# Patient Record
Sex: Male | Born: 1965 | Race: White | Hispanic: No | Smoking: Current every day smoker
Health system: Southern US, Community
[De-identification: ages and names within clinical notes are randomized; demographics above are authoritative.]

## PROBLEM LIST (undated history)

## (undated) DIAGNOSIS — F32A Depression, unspecified: Secondary | ICD-10-CM

## (undated) DIAGNOSIS — J189 Pneumonia, unspecified organism: Secondary | ICD-10-CM

## (undated) DIAGNOSIS — F329 Major depressive disorder, single episode, unspecified: Secondary | ICD-10-CM

## (undated) DIAGNOSIS — I639 Cerebral infarction, unspecified: Secondary | ICD-10-CM

## (undated) DIAGNOSIS — F1120 Opioid dependence, uncomplicated: Secondary | ICD-10-CM

## (undated) HISTORY — PX: CERVICAL SPINE SURGERY: SHX589

---

## 2009-05-03 ENCOUNTER — Emergency Department: Payer: Self-pay | Admitting: Internal Medicine

## 2009-09-01 ENCOUNTER — Emergency Department: Payer: Self-pay | Admitting: Internal Medicine

## 2010-10-18 ENCOUNTER — Emergency Department: Payer: Self-pay | Admitting: Emergency Medicine

## 2010-12-05 ENCOUNTER — Ambulatory Visit: Payer: Self-pay | Admitting: Family Medicine

## 2011-01-12 ENCOUNTER — Emergency Department: Payer: Self-pay | Admitting: *Deleted

## 2011-01-14 ENCOUNTER — Emergency Department: Payer: Self-pay | Admitting: Emergency Medicine

## 2011-02-19 ENCOUNTER — Ambulatory Visit
Admission: RE | Admit: 2011-02-19 | Discharge: 2011-02-19 | Disposition: A | Discharge status: Home / Self Care | Payer: BC Managed Care – PPO | Source: Ambulatory Visit | Attending: Neurosurgery | Admitting: Neurosurgery

## 2011-02-19 ENCOUNTER — Other Ambulatory Visit: Payer: Self-pay | Admitting: Neurosurgery

## 2011-02-19 DIAGNOSIS — M542 Cervicalgia: Secondary | ICD-10-CM

## 2011-02-19 DIAGNOSIS — M5020 Other cervical disc displacement, unspecified cervical region: Secondary | ICD-10-CM

## 2011-02-19 DIAGNOSIS — M47812 Spondylosis without myelopathy or radiculopathy, cervical region: Secondary | ICD-10-CM

## 2011-03-27 ENCOUNTER — Ambulatory Visit: Payer: Self-pay | Admitting: Neurosurgery

## 2011-07-07 ENCOUNTER — Emergency Department: Payer: Self-pay | Admitting: Emergency Medicine

## 2011-08-22 ENCOUNTER — Ambulatory Visit: Payer: Self-pay | Admitting: Unknown Physician Specialty

## 2011-08-22 LAB — URINALYSIS, COMPLETE
Bilirubin,UR: NEGATIVE
Blood: NEGATIVE
Glucose,UR: NEGATIVE mg/dL (ref 0–75)
Leukocyte Esterase: NEGATIVE
Nitrite: NEGATIVE
Ph: 5 (ref 4.5–8.0)
Protein: NEGATIVE
Squamous Epithelial: <1

## 2011-08-22 LAB — CBC
HCT: 36.3 % — ABNORMAL LOW (ref 40.0–52.0)
HGB: 12.4 g/dL — ABNORMAL LOW (ref 13.0–18.0)
MCHC: 34.1 g/dL (ref 32.0–36.0)
MCV: 93 fL (ref 80–100)
RDW: 13.3 % (ref 11.5–14.5)

## 2011-08-22 LAB — BASIC METABOLIC PANEL
Calcium, Total: 8.7 mg/dL (ref 8.5–10.1)
Creatinine: 0.95 mg/dL (ref 0.60–1.30)
EGFR (African American): >60
EGFR (Non-African Amer.): >60
Glucose: 85 mg/dL (ref 65–99)
Potassium: 4.5 mmol/L (ref 3.5–5.1)
Sodium: 142 mmol/L (ref 136–145)

## 2011-08-29 ENCOUNTER — Ambulatory Visit: Payer: Self-pay | Admitting: Unknown Physician Specialty

## 2012-02-29 ENCOUNTER — Emergency Department: Payer: Self-pay | Admitting: Internal Medicine

## 2012-02-29 LAB — CBC
HCT: 38 % — ABNORMAL LOW (ref 40.0–52.0)
MCV: 93 fL (ref 80–100)
Platelet: 412 10*3/uL (ref 150–440)
RBC: 4.06 10*6/uL — ABNORMAL LOW (ref 4.40–5.90)
WBC: 8.4 10*3/uL (ref 3.8–10.6)

## 2012-02-29 LAB — URINALYSIS, COMPLETE
Bacteria: NONE SEEN
Glucose,UR: NEGATIVE mg/dL (ref 0–75)
Leukocyte Esterase: NEGATIVE
Nitrite: NEGATIVE
Protein: NEGATIVE
Squamous Epithelial: <1
WBC UR: 2 /HPF (ref 0–5)

## 2012-02-29 LAB — BASIC METABOLIC PANEL
Calcium, Total: 8.8 mg/dL (ref 8.5–10.1)
Chloride: 103 mmol/L (ref 98–107)
Co2: 28 mmol/L (ref 21–32)
Creatinine: 1 mg/dL (ref 0.60–1.30)
Potassium: 4.3 mmol/L (ref 3.5–5.1)
Sodium: 139 mmol/L (ref 136–145)

## 2012-04-25 ENCOUNTER — Emergency Department: Payer: Self-pay | Admitting: Unknown Physician Specialty

## 2012-05-14 ENCOUNTER — Ambulatory Visit: Payer: Self-pay | Admitting: Pain Medicine

## 2012-06-25 ENCOUNTER — Emergency Department: Payer: Self-pay | Admitting: Emergency Medicine

## 2014-01-07 ENCOUNTER — Emergency Department: Payer: Self-pay | Admitting: Emergency Medicine

## 2014-01-11 ENCOUNTER — Emergency Department: Payer: Self-pay | Admitting: Emergency Medicine

## 2014-02-17 ENCOUNTER — Emergency Department: Payer: Self-pay | Admitting: Emergency Medicine

## 2014-02-21 ENCOUNTER — Emergency Department: Payer: Self-pay | Admitting: Emergency Medicine

## 2014-02-21 LAB — BASIC METABOLIC PANEL
ANION GAP: 5 — AB (ref 7–16)
BUN: 11 mg/dL (ref 7–18)
CO2: 30 mmol/L (ref 21–32)
CREATININE: 0.89 mg/dL (ref 0.60–1.30)
Calcium, Total: 8.5 mg/dL (ref 8.5–10.1)
Chloride: 101 mmol/L (ref 98–107)
EGFR (African American): >60
EGFR (Non-African Amer.): >60
Glucose: 113 mg/dL — ABNORMAL HIGH (ref 65–99)
OSMOLALITY: 272 (ref 275–301)
POTASSIUM: 3.8 mmol/L (ref 3.5–5.1)
SODIUM: 136 mmol/L (ref 136–145)

## 2014-02-21 LAB — CBC WITH DIFFERENTIAL/PLATELET
BASOS ABS: 0.1 10*3/uL (ref 0.0–0.1)
Basophil %: 1 %
EOS ABS: 0.4 10*3/uL (ref 0.0–0.7)
Eosinophil %: 4.8 %
HCT: 39.4 % — ABNORMAL LOW (ref 40.0–52.0)
HGB: 13.3 g/dL (ref 13.0–18.0)
Lymphocyte #: 1.4 10*3/uL (ref 1.0–3.6)
Lymphocyte %: 16.8 %
MCH: 34 pg (ref 26.0–34.0)
MCHC: 33.7 g/dL (ref 32.0–36.0)
MCV: 101 fL — AB (ref 80–100)
MONO ABS: 0.8 x10 3/mm (ref 0.2–1.0)
Monocyte %: 9.9 %
NEUTROS PCT: 67.5 %
Neutrophil #: 5.6 10*3/uL (ref 1.4–6.5)
Platelet: 429 10*3/uL (ref 150–440)
RBC: 3.9 10*6/uL — ABNORMAL LOW (ref 4.40–5.90)
RDW: 13.4 % (ref 11.5–14.5)
WBC: 8.3 10*3/uL (ref 3.8–10.6)

## 2014-04-24 ENCOUNTER — Inpatient Hospital Stay: Payer: Self-pay | Admitting: Internal Medicine

## 2014-04-24 LAB — COMPREHENSIVE METABOLIC PANEL
ALT: 90 U/L — AB
AST: 72 U/L — AB (ref 15–37)
Albumin: 3.3 g/dL — ABNORMAL LOW (ref 3.4–5.0)
Alkaline Phosphatase: 112 U/L
Anion Gap: 5 — ABNORMAL LOW (ref 7–16)
BUN: 12 mg/dL (ref 7–18)
Bilirubin,Total: 0.5 mg/dL (ref 0.2–1.0)
CALCIUM: 7.2 mg/dL — AB (ref 8.5–10.1)
CO2: 39 mmol/L — AB (ref 21–32)
Chloride: 95 mmol/L — ABNORMAL LOW (ref 98–107)
Creatinine: 0.99 mg/dL (ref 0.60–1.30)
EGFR (African American): >60
EGFR (Non-African Amer.): >60
GLUCOSE: 123 mg/dL — AB (ref 65–99)
Osmolality: 279 (ref 275–301)
POTASSIUM: 3.4 mmol/L — AB (ref 3.5–5.1)
SODIUM: 139 mmol/L (ref 136–145)
Total Protein: 6.8 g/dL (ref 6.4–8.2)

## 2014-04-24 LAB — INFLUENZA A,B,H1N1 - PCR (ARMC)
H1N1 flu by pcr: NOT DETECTED
INFLAPCR: NEGATIVE
Influenza B By PCR: NEGATIVE

## 2014-04-24 LAB — CBC WITH DIFFERENTIAL/PLATELET
BASOS PCT: 0.9 %
Basophil #: 0.1 10*3/uL (ref 0.0–0.1)
Eosinophil #: 0.2 10*3/uL (ref 0.0–0.7)
Eosinophil %: 3.2 %
HCT: 35.9 % — AB (ref 40.0–52.0)
HGB: 12.2 g/dL — AB (ref 13.0–18.0)
Lymphocyte #: 1.6 10*3/uL (ref 1.0–3.6)
Lymphocyte %: 20.3 %
MCH: 34.5 pg — AB (ref 26.0–34.0)
MCHC: 34.1 g/dL (ref 32.0–36.0)
MCV: 101 fL — ABNORMAL HIGH (ref 80–100)
Monocyte #: 0.7 x10 3/mm (ref 0.2–1.0)
Monocyte %: 8.5 %
NEUTROS ABS: 5.2 10*3/uL (ref 1.4–6.5)
NEUTROS PCT: 67.1 %
Platelet: 274 10*3/uL (ref 150–440)
RBC: 3.55 10*6/uL — ABNORMAL LOW (ref 4.40–5.90)
RDW: 14.2 % (ref 11.5–14.5)
WBC: 7.7 10*3/uL (ref 3.8–10.6)

## 2014-04-24 LAB — URINALYSIS, COMPLETE
BACTERIA: NONE SEEN
BILIRUBIN, UR: NEGATIVE
BLOOD: NEGATIVE
GLUCOSE, UR: NEGATIVE mg/dL (ref 0–75)
Ketone: NEGATIVE
LEUKOCYTE ESTERASE: NEGATIVE
Nitrite: NEGATIVE
PROTEIN: NEGATIVE
Ph: 8 (ref 4.5–8.0)
RBC,UR: NONE SEEN /HPF (ref 0–5)
Specific Gravity: 1.006 (ref 1.003–1.030)
Squamous Epithelial: NONE SEEN

## 2014-04-24 LAB — MAGNESIUM: Magnesium: 1.7 mg/dL — ABNORMAL LOW

## 2014-04-24 LAB — TROPONIN I: TROPONIN-I: 0.02 ng/mL

## 2014-04-25 ENCOUNTER — Inpatient Hospital Stay: Payer: Self-pay | Admitting: Specialist

## 2014-04-25 LAB — COMPREHENSIVE METABOLIC PANEL
ALBUMIN: 3.2 g/dL — AB (ref 3.4–5.0)
Alkaline Phosphatase: 94 U/L
Anion Gap: 5 — ABNORMAL LOW (ref 7–16)
BUN: 16 mg/dL (ref 7–18)
Bilirubin,Total: 0.4 mg/dL (ref 0.2–1.0)
CHLORIDE: 99 mmol/L (ref 98–107)
Calcium, Total: 7.1 mg/dL — ABNORMAL LOW (ref 8.5–10.1)
Co2: 35 mmol/L — ABNORMAL HIGH (ref 21–32)
Creatinine: 1.09 mg/dL (ref 0.60–1.30)
GLUCOSE: 147 mg/dL — AB (ref 65–99)
Osmolality: 281 (ref 275–301)
Potassium: 5.9 mmol/L — ABNORMAL HIGH (ref 3.5–5.1)
SGOT(AST): 62 U/L — ABNORMAL HIGH (ref 15–37)
SGPT (ALT): 72 U/L — ABNORMAL HIGH
Sodium: 139 mmol/L (ref 136–145)
Total Protein: 6.7 g/dL (ref 6.4–8.2)

## 2014-04-25 LAB — MAGNESIUM: Magnesium: 1.9 mg/dL

## 2014-04-25 LAB — URINALYSIS, COMPLETE
BILIRUBIN, UR: NEGATIVE
BLOOD: NEGATIVE
Bacteria: NONE SEEN
Glucose,UR: NEGATIVE mg/dL (ref 0–75)
KETONE: NEGATIVE
Leukocyte Esterase: NEGATIVE
Nitrite: NEGATIVE
Ph: 5 (ref 4.5–8.0)
Protein: NEGATIVE
Specific Gravity: 1.017 (ref 1.003–1.030)

## 2014-04-25 LAB — DRUG SCREEN, URINE
AMPHETAMINES, UR SCREEN: NEGATIVE (ref ?–1000)
Barbiturates, Ur Screen: NEGATIVE (ref ?–200)
Benzodiazepine, Ur Scrn: POSITIVE (ref ?–200)
COCAINE METABOLITE, UR ~~LOC~~: NEGATIVE (ref ?–300)
Cannabinoid 50 Ng, Ur ~~LOC~~: NEGATIVE (ref ?–50)
MDMA (Ecstasy)Ur Screen: NEGATIVE (ref ?–500)
Methadone, Ur Screen: POSITIVE (ref ?–300)
OPIATE, UR SCREEN: POSITIVE (ref ?–300)
Phencyclidine (PCP) Ur S: NEGATIVE (ref ?–25)
Tricyclic, Ur Screen: NEGATIVE (ref ?–1000)

## 2014-04-25 LAB — CBC WITH DIFFERENTIAL/PLATELET
BASOS PCT: 1.1 %
Basophil #: 0.1 10*3/uL (ref 0.0–0.1)
Eosinophil #: 0 10*3/uL (ref 0.0–0.7)
Eosinophil %: 0.2 %
HCT: 35.6 % — ABNORMAL LOW (ref 40.0–52.0)
HGB: 11.7 g/dL — AB (ref 13.0–18.0)
LYMPHS PCT: 16.9 %
Lymphocyte #: 1.8 10*3/uL (ref 1.0–3.6)
MCH: 34.9 pg — ABNORMAL HIGH (ref 26.0–34.0)
MCHC: 32.9 g/dL (ref 32.0–36.0)
MCV: 106 fL — AB (ref 80–100)
Monocyte #: 1.3 x10 3/mm — ABNORMAL HIGH (ref 0.2–1.0)
Monocyte %: 12.5 %
NEUTROS PCT: 69.3 %
Neutrophil #: 7.2 10*3/uL — ABNORMAL HIGH (ref 1.4–6.5)
Platelet: 282 10*3/uL (ref 150–440)
RBC: 3.36 10*6/uL — ABNORMAL LOW (ref 4.40–5.90)
RDW: 14.4 % (ref 11.5–14.5)
WBC: 10.5 10*3/uL (ref 3.8–10.6)

## 2014-04-25 LAB — TROPONIN I
TROPONIN-I: 0.1 ng/mL — AB
TROPONIN-I: 0.64 ng/mL — AB
Troponin-I: 0.51 ng/mL — ABNORMAL HIGH

## 2014-04-25 LAB — POTASSIUM: Potassium: 4.9 mmol/L (ref 3.5–5.1)

## 2014-04-25 LAB — PROTIME-INR
INR: 1.1
Prothrombin Time: 14 secs (ref 11.5–14.7)

## 2014-04-25 LAB — CK-MB
CK-MB: 3.5 ng/mL (ref 0.5–3.6)
CK-MB: 4.1 ng/mL — AB (ref 0.5–3.6)

## 2014-04-25 LAB — PHOSPHORUS: Phosphorus: 3.3 mg/dL (ref 2.5–4.9)

## 2014-04-26 DIAGNOSIS — I340 Nonrheumatic mitral (valve) insufficiency: Secondary | ICD-10-CM

## 2014-04-26 LAB — PHOSPHORUS: Phosphorus: 1.8 mg/dL — ABNORMAL LOW (ref 2.5–4.9)

## 2014-04-26 LAB — CBC WITH DIFFERENTIAL/PLATELET
BASOS ABS: 0.1 10*3/uL (ref 0.0–0.1)
Basophil %: 0.6 %
EOS ABS: 0.4 10*3/uL (ref 0.0–0.7)
Eosinophil %: 4.1 %
HCT: 32.6 % — ABNORMAL LOW (ref 40.0–52.0)
HGB: 10.8 g/dL — AB (ref 13.0–18.0)
LYMPHS PCT: 23.3 %
Lymphocyte #: 2.3 10*3/uL (ref 1.0–3.6)
MCH: 34.7 pg — ABNORMAL HIGH (ref 26.0–34.0)
MCHC: 33 g/dL (ref 32.0–36.0)
MCV: 105 fL — ABNORMAL HIGH (ref 80–100)
Monocyte #: 0.8 x10 3/mm (ref 0.2–1.0)
Monocyte %: 8.4 %
Neutrophil #: 6.1 10*3/uL (ref 1.4–6.5)
Neutrophil %: 63.6 %
PLATELETS: 228 10*3/uL (ref 150–440)
RBC: 3.1 10*6/uL — ABNORMAL LOW (ref 4.40–5.90)
RDW: 14.5 % (ref 11.5–14.5)
WBC: 9.7 10*3/uL (ref 3.8–10.6)

## 2014-04-26 LAB — BASIC METABOLIC PANEL
Anion Gap: 0 — ABNORMAL LOW (ref 7–16)
BUN: 16 mg/dL (ref 7–18)
CALCIUM: 6.7 mg/dL — AB (ref 8.5–10.1)
CREATININE: 0.85 mg/dL (ref 0.60–1.30)
Chloride: 105 mmol/L (ref 98–107)
Co2: 39 mmol/L — ABNORMAL HIGH (ref 21–32)
EGFR (African American): >60
Glucose: 105 mg/dL — ABNORMAL HIGH (ref 65–99)
Osmolality: 288 (ref 275–301)
POTASSIUM: 3.9 mmol/L (ref 3.5–5.1)
Sodium: 144 mmol/L (ref 136–145)

## 2014-04-26 LAB — MAGNESIUM: MAGNESIUM: 2 mg/dL

## 2014-04-26 LAB — CK-MB: CK-MB: 4.4 ng/mL — AB (ref 0.5–3.6)

## 2014-04-26 LAB — URINE CULTURE

## 2014-04-27 LAB — CBC WITH DIFFERENTIAL/PLATELET
BASOS PCT: 0.4 %
Basophil #: 0 10*3/uL (ref 0.0–0.1)
EOS PCT: 5.4 %
Eosinophil #: 0.4 10*3/uL (ref 0.0–0.7)
HCT: 31.4 % — ABNORMAL LOW (ref 40.0–52.0)
HGB: 10.4 g/dL — ABNORMAL LOW (ref 13.0–18.0)
LYMPHS PCT: 16.3 %
Lymphocyte #: 1.3 10*3/uL (ref 1.0–3.6)
MCH: 34.6 pg — ABNORMAL HIGH (ref 26.0–34.0)
MCHC: 33.3 g/dL (ref 32.0–36.0)
MCV: 104 fL — ABNORMAL HIGH (ref 80–100)
MONO ABS: 0.7 x10 3/mm (ref 0.2–1.0)
MONOS PCT: 8.6 %
NEUTROS ABS: 5.6 10*3/uL (ref 1.4–6.5)
Neutrophil %: 69.3 %
Platelet: 224 10*3/uL (ref 150–440)
RBC: 3.01 10*6/uL — AB (ref 4.40–5.90)
RDW: 13.9 % (ref 11.5–14.5)
WBC: 8.1 10*3/uL (ref 3.8–10.6)

## 2014-04-27 LAB — BASIC METABOLIC PANEL
Anion Gap: 4 — ABNORMAL LOW (ref 7–16)
BUN: 11 mg/dL (ref 7–18)
CO2: 34 mmol/L — AB (ref 21–32)
Calcium, Total: 7.4 mg/dL — ABNORMAL LOW (ref 8.5–10.1)
Chloride: 103 mmol/L (ref 98–107)
Creatinine: 0.77 mg/dL (ref 0.60–1.30)
EGFR (African American): >60
EGFR (Non-African Amer.): >60
GLUCOSE: 90 mg/dL (ref 65–99)
Osmolality: 280 (ref 275–301)
POTASSIUM: 3.5 mmol/L (ref 3.5–5.1)
Sodium: 141 mmol/L (ref 136–145)

## 2014-04-27 LAB — PHOSPHORUS: Phosphorus: 3.4 mg/dL (ref 2.5–4.9)

## 2014-04-27 LAB — MAGNESIUM: Magnesium: 1.9 mg/dL

## 2014-04-28 LAB — CBC WITH DIFFERENTIAL/PLATELET
BASOS PCT: 0.3 %
Basophil #: 0 10*3/uL (ref 0.0–0.1)
Eosinophil #: 0 10*3/uL (ref 0.0–0.7)
Eosinophil %: 0 %
HCT: 32.1 % — AB (ref 40.0–52.0)
HGB: 10.6 g/dL — AB (ref 13.0–18.0)
LYMPHS ABS: 0.6 10*3/uL — AB (ref 1.0–3.6)
LYMPHS PCT: 9.7 %
MCH: 34.3 pg — ABNORMAL HIGH (ref 26.0–34.0)
MCHC: 32.9 g/dL (ref 32.0–36.0)
MCV: 104 fL — ABNORMAL HIGH (ref 80–100)
Monocyte #: 0.4 x10 3/mm (ref 0.2–1.0)
Monocyte %: 6 %
NEUTROS ABS: 5.1 10*3/uL (ref 1.4–6.5)
Neutrophil %: 84 %
PLATELETS: 238 10*3/uL (ref 150–440)
RBC: 3.08 10*6/uL — ABNORMAL LOW (ref 4.40–5.90)
RDW: 14.5 % (ref 11.5–14.5)
WBC: 6 10*3/uL (ref 3.8–10.6)

## 2014-04-28 LAB — MAGNESIUM: Magnesium: 2.4 mg/dL

## 2014-04-28 LAB — BASIC METABOLIC PANEL
ANION GAP: 3 — AB (ref 7–16)
BUN: 9 mg/dL (ref 7–18)
CO2: 33 mmol/L — AB (ref 21–32)
CREATININE: 0.73 mg/dL (ref 0.60–1.30)
Calcium, Total: 7.6 mg/dL — ABNORMAL LOW (ref 8.5–10.1)
Chloride: 110 mmol/L — ABNORMAL HIGH (ref 98–107)
EGFR (African American): >60
EGFR (Non-African Amer.): >60
Glucose: 142 mg/dL — ABNORMAL HIGH (ref 65–99)
Osmolality: 292 (ref 275–301)
POTASSIUM: 4.2 mmol/L (ref 3.5–5.1)
Sodium: 146 mmol/L — ABNORMAL HIGH (ref 136–145)

## 2014-04-29 LAB — BASIC METABOLIC PANEL
ANION GAP: 6 — AB (ref 7–16)
BUN: 16 mg/dL (ref 7–18)
CREATININE: 0.75 mg/dL (ref 0.60–1.30)
Calcium, Total: 7.4 mg/dL — ABNORMAL LOW (ref 8.5–10.1)
Chloride: 106 mmol/L (ref 98–107)
Co2: 29 mmol/L (ref 21–32)
EGFR (African American): >60
GLUCOSE: 132 mg/dL — AB (ref 65–99)
Osmolality: 284 (ref 275–301)
Potassium: 3.7 mmol/L (ref 3.5–5.1)
Sodium: 141 mmol/L (ref 136–145)

## 2014-04-29 LAB — RAPID HIV SCREEN (HIV 1/2 AB+AG)

## 2014-04-29 LAB — MAGNESIUM: Magnesium: 1.9 mg/dL

## 2014-04-29 LAB — PHOSPHORUS: Phosphorus: 3 mg/dL (ref 2.5–4.9)

## 2014-04-30 DIAGNOSIS — J9600 Acute respiratory failure, unspecified whether with hypoxia or hypercapnia: Secondary | ICD-10-CM | POA: Diagnosis not present

## 2014-04-30 DIAGNOSIS — G9341 Metabolic encephalopathy: Secondary | ICD-10-CM

## 2014-04-30 LAB — BASIC METABOLIC PANEL
Anion Gap: 5 — ABNORMAL LOW (ref 7–16)
BUN: 18 mg/dL (ref 7–18)
CO2: 31 mmol/L (ref 21–32)
Calcium, Total: 7.8 mg/dL — ABNORMAL LOW (ref 8.5–10.1)
Chloride: 104 mmol/L (ref 98–107)
Creatinine: 0.75 mg/dL (ref 0.60–1.30)
EGFR (African American): >60
Glucose: 142 mg/dL — ABNORMAL HIGH (ref 65–99)
Osmolality: 284 (ref 275–301)
Potassium: 4 mmol/L (ref 3.5–5.1)
Sodium: 140 mmol/L (ref 136–145)

## 2014-04-30 LAB — CBC WITH DIFFERENTIAL/PLATELET
Basophil #: 0 10*3/uL (ref 0.0–0.1)
Basophil %: 0.6 %
EOS ABS: 0 10*3/uL (ref 0.0–0.7)
Eosinophil %: 0 %
HCT: 35.9 % — AB (ref 40.0–52.0)
HGB: 11.8 g/dL — AB (ref 13.0–18.0)
Lymphocyte #: 0.6 10*3/uL — ABNORMAL LOW (ref 1.0–3.6)
Lymphocyte %: 7.2 %
MCH: 34 pg (ref 26.0–34.0)
MCHC: 32.9 g/dL (ref 32.0–36.0)
MCV: 103 fL — ABNORMAL HIGH (ref 80–100)
MONO ABS: 0.6 x10 3/mm (ref 0.2–1.0)
Monocyte %: 7.1 %
Neutrophil #: 7 10*3/uL — ABNORMAL HIGH (ref 1.4–6.5)
Neutrophil %: 85.1 %
Platelet: 289 10*3/uL (ref 150–440)
RBC: 3.49 10*6/uL — AB (ref 4.40–5.90)
RDW: 14.6 % — AB (ref 11.5–14.5)
WBC: 8.3 10*3/uL (ref 3.8–10.6)

## 2014-04-30 LAB — TRIGLYCERIDES: Triglycerides: 148 mg/dL (ref 0–200)

## 2014-04-30 LAB — VANCOMYCIN, TROUGH
Vancomycin, Trough: 19 ug/mL (ref 10–20)
Vancomycin, Trough: 21 ug/mL (ref 10–20)

## 2014-04-30 LAB — MAGNESIUM: MAGNESIUM: 2.2 mg/dL

## 2014-04-30 LAB — PHOSPHORUS: PHOSPHORUS: 4.3 mg/dL (ref 2.5–4.9)

## 2014-04-30 LAB — CULTURE, BLOOD (SINGLE)

## 2014-04-30 LAB — CLOSTRIDIUM DIFFICILE(ARMC)

## 2014-04-30 LAB — EXPECTORATED SPUTUM ASSESSMENT W GRAM STAIN, RFLX TO RESP C

## 2014-05-01 DIAGNOSIS — G9341 Metabolic encephalopathy: Secondary | ICD-10-CM | POA: Diagnosis not present

## 2014-05-01 DIAGNOSIS — J9600 Acute respiratory failure, unspecified whether with hypoxia or hypercapnia: Secondary | ICD-10-CM | POA: Diagnosis not present

## 2014-05-01 LAB — CBC WITH DIFFERENTIAL/PLATELET
Basophil #: 0.1 10*3/uL (ref 0.0–0.1)
Basophil %: 1.1 %
EOS ABS: 0 10*3/uL (ref 0.0–0.7)
EOS PCT: 0.2 %
HCT: 34.8 % — ABNORMAL LOW (ref 40.0–52.0)
HGB: 11.2 g/dL — ABNORMAL LOW (ref 13.0–18.0)
Lymphocyte #: 0.9 10*3/uL — ABNORMAL LOW (ref 1.0–3.6)
Lymphocyte %: 12.2 %
MCH: 33.8 pg (ref 26.0–34.0)
MCHC: 32.3 g/dL (ref 32.0–36.0)
MCV: 105 fL — AB (ref 80–100)
MONO ABS: 0.7 x10 3/mm (ref 0.2–1.0)
MONOS PCT: 10.4 %
NEUTROS PCT: 76.1 %
Neutrophil #: 5.4 10*3/uL (ref 1.4–6.5)
PLATELETS: 282 10*3/uL (ref 150–440)
RBC: 3.33 10*6/uL — ABNORMAL LOW (ref 4.40–5.90)
RDW: 14.8 % — ABNORMAL HIGH (ref 11.5–14.5)
WBC: 7.1 10*3/uL (ref 3.8–10.6)

## 2014-05-01 LAB — BASIC METABOLIC PANEL
ANION GAP: 4 — AB (ref 7–16)
BUN: 16 mg/dL (ref 7–18)
Calcium, Total: 7.3 mg/dL — ABNORMAL LOW (ref 8.5–10.1)
Chloride: 106 mmol/L (ref 98–107)
Co2: 34 mmol/L — ABNORMAL HIGH (ref 21–32)
Creatinine: 0.64 mg/dL (ref 0.60–1.30)
EGFR (African American): >60
GLUCOSE: 129 mg/dL — AB (ref 65–99)
Osmolality: 290 (ref 275–301)
Potassium: 3.9 mmol/L (ref 3.5–5.1)
Sodium: 144 mmol/L (ref 136–145)

## 2014-05-02 LAB — CBC WITH DIFFERENTIAL/PLATELET
Basophil #: 0 10*3/uL (ref 0.0–0.1)
Basophil %: 0.5 %
EOS ABS: 0.1 10*3/uL (ref 0.0–0.7)
Eosinophil %: 1.1 %
HCT: 36.3 % — AB (ref 40.0–52.0)
HGB: 11.9 g/dL — ABNORMAL LOW (ref 13.0–18.0)
Lymphocyte #: 1 10*3/uL (ref 1.0–3.6)
Lymphocyte %: 13.3 %
MCH: 33.9 pg (ref 26.0–34.0)
MCHC: 32.7 g/dL (ref 32.0–36.0)
MCV: 104 fL — ABNORMAL HIGH (ref 80–100)
MONO ABS: 0.7 x10 3/mm (ref 0.2–1.0)
Monocyte %: 10 %
NEUTROS ABS: 5.5 10*3/uL (ref 1.4–6.5)
Neutrophil %: 75.1 %
Platelet: 326 10*3/uL (ref 150–440)
RBC: 3.5 10*6/uL — ABNORMAL LOW (ref 4.40–5.90)
RDW: 14.8 % — ABNORMAL HIGH (ref 11.5–14.5)
WBC: 7.4 10*3/uL (ref 3.8–10.6)

## 2014-05-02 LAB — BASIC METABOLIC PANEL
ANION GAP: 6 — AB (ref 7–16)
BUN: 15 mg/dL (ref 7–18)
CALCIUM: 7.5 mg/dL — AB (ref 8.5–10.1)
Chloride: 106 mmol/L (ref 98–107)
Co2: 33 mmol/L — ABNORMAL HIGH (ref 21–32)
Creatinine: 0.66 mg/dL (ref 0.60–1.30)
EGFR (Non-African Amer.): >60
Glucose: 116 mg/dL — ABNORMAL HIGH (ref 65–99)
OSMOLALITY: 291 (ref 275–301)
POTASSIUM: 4 mmol/L (ref 3.5–5.1)
Sodium: 145 mmol/L (ref 136–145)

## 2014-05-02 LAB — PHOSPHORUS: Phosphorus: 3.9 mg/dL (ref 2.5–4.9)

## 2014-05-02 LAB — TRIGLYCERIDES: Triglycerides: 181 mg/dL (ref 0–200)

## 2014-05-02 LAB — MAGNESIUM: Magnesium: 2.1 mg/dL

## 2014-05-04 LAB — BASIC METABOLIC PANEL
ANION GAP: 3 — AB (ref 7–16)
BUN: 9 mg/dL (ref 7–18)
CALCIUM: 7.6 mg/dL — AB (ref 8.5–10.1)
Chloride: 104 mmol/L (ref 98–107)
Co2: 36 mmol/L — ABNORMAL HIGH (ref 21–32)
Creatinine: 0.62 mg/dL (ref 0.60–1.30)
EGFR (Non-African Amer.): >60
Glucose: 99 mg/dL (ref 65–99)
Osmolality: 284 (ref 275–301)
Potassium: 3.5 mmol/L (ref 3.5–5.1)
Sodium: 143 mmol/L (ref 136–145)

## 2014-05-04 LAB — MAGNESIUM: Magnesium: 2.2 mg/dL

## 2014-05-04 LAB — PHOSPHORUS: Phosphorus: 3.9 mg/dL (ref 2.5–4.9)

## 2014-05-04 LAB — TRIGLYCERIDES: Triglycerides: 186 mg/dL (ref 0–200)

## 2014-05-05 LAB — CBC WITH DIFFERENTIAL/PLATELET
BASOS ABS: 0.1 10*3/uL (ref 0.0–0.1)
Basophil %: 0.7 %
EOS ABS: 0.5 10*3/uL (ref 0.0–0.7)
Eosinophil %: 4.7 %
HCT: 33.4 % — ABNORMAL LOW (ref 40.0–52.0)
HGB: 11.2 g/dL — AB (ref 13.0–18.0)
LYMPHS PCT: 15.4 %
Lymphocyte #: 1.6 10*3/uL (ref 1.0–3.6)
MCH: 33.6 pg (ref 26.0–34.0)
MCHC: 33.4 g/dL (ref 32.0–36.0)
MCV: 101 fL — ABNORMAL HIGH (ref 80–100)
Monocyte #: 0.6 x10 3/mm (ref 0.2–1.0)
Monocyte %: 6.2 %
NEUTROS ABS: 7.4 10*3/uL — AB (ref 1.4–6.5)
NEUTROS PCT: 73 %
PLATELETS: 272 10*3/uL (ref 150–440)
RBC: 3.32 10*6/uL — AB (ref 4.40–5.90)
RDW: 13.9 % (ref 11.5–14.5)
WBC: 10.1 10*3/uL (ref 3.8–10.6)

## 2014-05-05 LAB — BASIC METABOLIC PANEL
Anion Gap: 3 — ABNORMAL LOW (ref 7–16)
BUN: 9 mg/dL (ref 7–18)
CHLORIDE: 105 mmol/L (ref 98–107)
CREATININE: 0.61 mg/dL (ref 0.60–1.30)
Calcium, Total: 7.8 mg/dL — ABNORMAL LOW (ref 8.5–10.1)
Co2: 34 mmol/L — ABNORMAL HIGH (ref 21–32)
EGFR (African American): >60
EGFR (Non-African Amer.): >60
Glucose: 121 mg/dL — ABNORMAL HIGH (ref 65–99)
Osmolality: 283 (ref 275–301)
Potassium: 3.8 mmol/L (ref 3.5–5.1)
Sodium: 142 mmol/L (ref 136–145)

## 2014-05-07 LAB — CBC WITH DIFFERENTIAL/PLATELET
BASOS PCT: 0.5 %
Basophil #: 0.1 10*3/uL (ref 0.0–0.1)
EOS PCT: 3 %
Eosinophil #: 0.5 10*3/uL (ref 0.0–0.7)
HCT: 35 % — AB (ref 40.0–52.0)
HGB: 11.5 g/dL — AB (ref 13.0–18.0)
Lymphocyte #: 1.2 10*3/uL (ref 1.0–3.6)
Lymphocyte %: 8 %
MCH: 33.3 pg (ref 26.0–34.0)
MCHC: 32.9 g/dL (ref 32.0–36.0)
MCV: 101 fL — ABNORMAL HIGH (ref 80–100)
Monocyte #: 0.9 x10 3/mm (ref 0.2–1.0)
Monocyte %: 5.6 %
Neutrophil #: 12.8 10*3/uL — ABNORMAL HIGH (ref 1.4–6.5)
Neutrophil %: 82.9 %
PLATELETS: 436 10*3/uL (ref 150–440)
RBC: 3.46 10*6/uL — AB (ref 4.40–5.90)
RDW: 14.5 % (ref 11.5–14.5)
WBC: 15.4 10*3/uL — ABNORMAL HIGH (ref 3.8–10.6)

## 2014-05-07 LAB — BASIC METABOLIC PANEL
Anion Gap: 4 — ABNORMAL LOW (ref 7–16)
BUN: 9 mg/dL (ref 7–18)
Calcium, Total: 7.8 mg/dL — ABNORMAL LOW (ref 8.5–10.1)
Chloride: 101 mmol/L (ref 98–107)
Co2: 32 mmol/L (ref 21–32)
Creatinine: 0.74 mg/dL (ref 0.60–1.30)
EGFR (Non-African Amer.): >60
GLUCOSE: 117 mg/dL — AB (ref 65–99)
Osmolality: 274 (ref 275–301)
Potassium: 4.1 mmol/L (ref 3.5–5.1)
SODIUM: 137 mmol/L (ref 136–145)

## 2014-05-08 LAB — CREATININE, SERUM
CREATININE: 0.76 mg/dL (ref 0.60–1.30)
EGFR (Non-African Amer.): >60

## 2014-05-09 LAB — CBC WITH DIFFERENTIAL/PLATELET
Basophil #: 0.1 10*3/uL (ref 0.0–0.1)
Basophil %: 0.5 %
EOS ABS: 0.8 10*3/uL — AB (ref 0.0–0.7)
Eosinophil %: 4.2 %
HCT: 31.6 % — ABNORMAL LOW (ref 40.0–52.0)
HGB: 10.3 g/dL — AB (ref 13.0–18.0)
LYMPHS ABS: 0.7 10*3/uL — AB (ref 1.0–3.6)
LYMPHS PCT: 3.7 %
MCH: 33.2 pg (ref 26.0–34.0)
MCHC: 32.7 g/dL (ref 32.0–36.0)
MCV: 101 fL — ABNORMAL HIGH (ref 80–100)
Monocyte #: 1 x10 3/mm (ref 0.2–1.0)
Monocyte %: 5.5 %
Neutrophil #: 16.1 10*3/uL — ABNORMAL HIGH (ref 1.4–6.5)
Neutrophil %: 86.1 %
Platelet: 428 10*3/uL (ref 150–440)
RBC: 3.11 10*6/uL — ABNORMAL LOW (ref 4.40–5.90)
RDW: 14.6 % — ABNORMAL HIGH (ref 11.5–14.5)
WBC: 18.7 10*3/uL — ABNORMAL HIGH (ref 3.8–10.6)

## 2014-05-09 LAB — CREATININE, SERUM
CREATININE: 0.88 mg/dL (ref 0.60–1.30)
EGFR (African American): >60
EGFR (Non-African Amer.): >60

## 2014-05-09 LAB — VANCOMYCIN, TROUGH: Vancomycin, Trough: 24 ug/mL (ref 10–20)

## 2014-05-10 LAB — URINALYSIS, COMPLETE
Bacteria: NONE SEEN
Bilirubin,UR: NEGATIVE
GLUCOSE, UR: NEGATIVE mg/dL (ref 0–75)
Ketone: NEGATIVE
LEUKOCYTE ESTERASE: NEGATIVE
NITRITE: NEGATIVE
Ph: 6 (ref 4.5–8.0)
Protein: 30
RBC,UR: 1 /HPF (ref 0–5)
SPECIFIC GRAVITY: 1.011 (ref 1.003–1.030)
WBC UR: 3 /HPF (ref 0–5)

## 2014-05-10 LAB — CBC WITH DIFFERENTIAL/PLATELET
BASOS PCT: 0.8 %
Basophil #: 0.1 10*3/uL (ref 0.0–0.1)
EOS PCT: 4.7 %
Eosinophil #: 0.8 10*3/uL — ABNORMAL HIGH (ref 0.0–0.7)
HCT: 34.1 % — AB (ref 40.0–52.0)
HGB: 11 g/dL — ABNORMAL LOW (ref 13.0–18.0)
LYMPHS PCT: 4.1 %
Lymphocyte #: 0.7 10*3/uL — ABNORMAL LOW (ref 1.0–3.6)
MCH: 32.8 pg (ref 26.0–34.0)
MCHC: 32.1 g/dL (ref 32.0–36.0)
MCV: 102 fL — AB (ref 80–100)
Monocyte #: 0.7 x10 3/mm (ref 0.2–1.0)
Monocyte %: 4.5 %
NEUTROS ABS: 13.9 10*3/uL — AB (ref 1.4–6.5)
NEUTROS PCT: 85.9 %
Platelet: 490 10*3/uL — ABNORMAL HIGH (ref 150–440)
RBC: 3.34 10*6/uL — AB (ref 4.40–5.90)
RDW: 14.7 % — AB (ref 11.5–14.5)
WBC: 16.2 10*3/uL — AB (ref 3.8–10.6)

## 2014-05-10 LAB — BASIC METABOLIC PANEL
Anion Gap: 8 (ref 7–16)
BUN: 16 mg/dL (ref 7–18)
CHLORIDE: 101 mmol/L (ref 98–107)
CREATININE: 1.07 mg/dL (ref 0.60–1.30)
Calcium, Total: 8 mg/dL — ABNORMAL LOW (ref 8.5–10.1)
Co2: 31 mmol/L (ref 21–32)
EGFR (African American): >60
EGFR (Non-African Amer.): >60
GLUCOSE: 115 mg/dL — AB (ref 65–99)
Osmolality: 282 (ref 275–301)
Potassium: 3.5 mmol/L (ref 3.5–5.1)
Sodium: 140 mmol/L (ref 136–145)

## 2014-05-10 LAB — MAGNESIUM: MAGNESIUM: 1.9 mg/dL

## 2014-05-10 LAB — PHOSPHORUS: Phosphorus: 3.3 mg/dL (ref 2.5–4.9)

## 2014-05-10 LAB — CLOSTRIDIUM DIFFICILE(ARMC)

## 2014-05-11 LAB — CBC WITH DIFFERENTIAL/PLATELET
BASOS ABS: 0.1 10*3/uL (ref 0.0–0.1)
Basophil %: 0.5 %
EOS PCT: 5 %
Eosinophil #: 0.7 10*3/uL (ref 0.0–0.7)
HCT: 29.7 % — ABNORMAL LOW (ref 40.0–52.0)
HGB: 9.8 g/dL — AB (ref 13.0–18.0)
LYMPHS ABS: 0.8 10*3/uL — AB (ref 1.0–3.6)
Lymphocyte %: 6.2 %
MCH: 33.5 pg (ref 26.0–34.0)
MCHC: 33 g/dL (ref 32.0–36.0)
MCV: 102 fL — ABNORMAL HIGH (ref 80–100)
Monocyte #: 1.1 x10 3/mm — ABNORMAL HIGH (ref 0.2–1.0)
Monocyte %: 8 %
Neutrophil #: 11 10*3/uL — ABNORMAL HIGH (ref 1.4–6.5)
Neutrophil %: 80.3 %
Platelet: 447 10*3/uL — ABNORMAL HIGH (ref 150–440)
RBC: 2.92 10*6/uL — AB (ref 4.40–5.90)
RDW: 14.6 % — AB (ref 11.5–14.5)
WBC: 13.7 10*3/uL — ABNORMAL HIGH (ref 3.8–10.6)

## 2014-05-11 LAB — BASIC METABOLIC PANEL
Anion Gap: 4 — ABNORMAL LOW (ref 7–16)
BUN: 14 mg/dL (ref 7–18)
Calcium, Total: 8 mg/dL — ABNORMAL LOW (ref 8.5–10.1)
Chloride: 101 mmol/L (ref 98–107)
Co2: 34 mmol/L — ABNORMAL HIGH (ref 21–32)
Creatinine: 1.11 mg/dL (ref 0.60–1.30)
EGFR (Non-African Amer.): >60
Glucose: 118 mg/dL — ABNORMAL HIGH (ref 65–99)
Osmolality: 279 (ref 275–301)
POTASSIUM: 3.2 mmol/L — AB (ref 3.5–5.1)
SODIUM: 139 mmol/L (ref 136–145)

## 2014-05-11 LAB — PHOSPHORUS: Phosphorus: 4.3 mg/dL (ref 2.5–4.9)

## 2014-05-11 LAB — MAGNESIUM: MAGNESIUM: 2.2 mg/dL

## 2014-05-11 LAB — TRIGLYCERIDES: TRIGLYCERIDES: 255 mg/dL — AB (ref 0–200)

## 2014-05-11 LAB — URINE CULTURE

## 2014-05-12 LAB — BASIC METABOLIC PANEL
Anion Gap: 7 (ref 7–16)
BUN: 16 mg/dL (ref 7–18)
Calcium, Total: 8 mg/dL — ABNORMAL LOW (ref 8.5–10.1)
Chloride: 100 mmol/L (ref 98–107)
Co2: 34 mmol/L — ABNORMAL HIGH (ref 21–32)
Creatinine: 1.1 mg/dL (ref 0.60–1.30)
EGFR (Non-African Amer.): >60
GLUCOSE: 122 mg/dL — AB (ref 65–99)
OSMOLALITY: 284 (ref 275–301)
POTASSIUM: 3.2 mmol/L — AB (ref 3.5–5.1)
Sodium: 141 mmol/L (ref 136–145)

## 2014-05-12 LAB — CBC WITH DIFFERENTIAL/PLATELET
BASOS ABS: 0.1 10*3/uL (ref 0.0–0.1)
Basophil %: 1.1 %
Eosinophil #: 0.9 10*3/uL — ABNORMAL HIGH (ref 0.0–0.7)
Eosinophil %: 8.5 %
HCT: 29.1 % — ABNORMAL LOW (ref 40.0–52.0)
HGB: 9.6 g/dL — AB (ref 13.0–18.0)
LYMPHS ABS: 1 10*3/uL (ref 1.0–3.6)
Lymphocyte %: 9.5 %
MCH: 33.6 pg (ref 26.0–34.0)
MCHC: 33.1 g/dL (ref 32.0–36.0)
MCV: 102 fL — ABNORMAL HIGH (ref 80–100)
Monocyte #: 1.1 x10 3/mm — ABNORMAL HIGH (ref 0.2–1.0)
Monocyte %: 10.6 %
Neutrophil #: 7.1 10*3/uL — ABNORMAL HIGH (ref 1.4–6.5)
Neutrophil %: 70.3 %
PLATELETS: 403 10*3/uL (ref 150–440)
RBC: 2.86 10*6/uL — AB (ref 4.40–5.90)
RDW: 14.9 % — AB (ref 11.5–14.5)
WBC: 10.1 10*3/uL (ref 3.8–10.6)

## 2014-05-12 LAB — BRONCHIAL WASH CULTURE

## 2014-05-12 LAB — CULTURE, BLOOD (SINGLE)

## 2014-05-12 LAB — POTASSIUM: POTASSIUM: 3.4 mmol/L — AB (ref 3.5–5.1)

## 2014-05-13 LAB — CBC WITH DIFFERENTIAL/PLATELET
BASOS ABS: 0.2 10*3/uL — AB (ref 0.0–0.1)
Basophil %: 1.4 %
Eosinophil #: 0.9 10*3/uL — ABNORMAL HIGH (ref 0.0–0.7)
Eosinophil %: 8.3 %
HCT: 30.4 % — AB (ref 40.0–52.0)
HGB: 9.6 g/dL — ABNORMAL LOW (ref 13.0–18.0)
Lymphocyte #: 1.3 10*3/uL (ref 1.0–3.6)
Lymphocyte %: 11.6 %
MCH: 32.1 pg (ref 26.0–34.0)
MCHC: 31.5 g/dL — AB (ref 32.0–36.0)
MCV: 102 fL — ABNORMAL HIGH (ref 80–100)
MONOS PCT: 7.5 %
Monocyte #: 0.8 x10 3/mm (ref 0.2–1.0)
NEUTROS PCT: 71.2 %
Neutrophil #: 7.9 10*3/uL — ABNORMAL HIGH (ref 1.4–6.5)
PLATELETS: 393 10*3/uL (ref 150–440)
RBC: 2.98 10*6/uL — AB (ref 4.40–5.90)
RDW: 14.8 % — ABNORMAL HIGH (ref 11.5–14.5)
WBC: 11.1 10*3/uL — ABNORMAL HIGH (ref 3.8–10.6)

## 2014-05-13 LAB — COMPREHENSIVE METABOLIC PANEL
ANION GAP: 4 — AB (ref 7–16)
AST: 52 U/L — AB (ref 15–37)
Albumin: 1.7 g/dL — ABNORMAL LOW (ref 3.4–5.0)
Alkaline Phosphatase: 85 U/L
BUN: 16 mg/dL (ref 7–18)
Bilirubin,Total: 0.3 mg/dL (ref 0.2–1.0)
CALCIUM: 7.8 mg/dL — AB (ref 8.5–10.1)
Chloride: 100 mmol/L (ref 98–107)
Co2: 37 mmol/L — ABNORMAL HIGH (ref 21–32)
Creatinine: 1.02 mg/dL (ref 0.60–1.30)
EGFR (Non-African Amer.): >60
Glucose: 104 mg/dL — ABNORMAL HIGH (ref 65–99)
OSMOLALITY: 283 (ref 275–301)
POTASSIUM: 3.8 mmol/L (ref 3.5–5.1)
SGPT (ALT): 37 U/L
Sodium: 141 mmol/L (ref 136–145)
TOTAL PROTEIN: 6.1 g/dL — AB (ref 6.4–8.2)

## 2014-05-14 LAB — CBC WITH DIFFERENTIAL/PLATELET
BASOS ABS: 0.2 10*3/uL — AB (ref 0.0–0.1)
Basophil %: 1.3 %
EOS PCT: 9 %
Eosinophil #: 1 10*3/uL — ABNORMAL HIGH (ref 0.0–0.7)
HCT: 29.5 % — ABNORMAL LOW (ref 40.0–52.0)
HGB: 10.1 g/dL — ABNORMAL LOW (ref 13.0–18.0)
Lymphocyte #: 1.5 10*3/uL (ref 1.0–3.6)
Lymphocyte %: 13.2 %
MCH: 34.6 pg — ABNORMAL HIGH (ref 26.0–34.0)
MCHC: 34.3 g/dL (ref 32.0–36.0)
MCV: 101 fL — ABNORMAL HIGH (ref 80–100)
MONO ABS: 0.7 x10 3/mm (ref 0.2–1.0)
MONOS PCT: 5.8 %
Neutrophil #: 8.2 10*3/uL — ABNORMAL HIGH (ref 1.4–6.5)
Neutrophil %: 70.7 %
PLATELETS: 364 10*3/uL (ref 150–440)
RBC: 2.92 10*6/uL — AB (ref 4.40–5.90)
RDW: 14.8 % — AB (ref 11.5–14.5)
WBC: 11.6 10*3/uL — AB (ref 3.8–10.6)

## 2014-05-14 LAB — BASIC METABOLIC PANEL
Anion Gap: 2 — ABNORMAL LOW (ref 7–16)
BUN: 15 mg/dL (ref 7–18)
CO2: 33 mmol/L — AB (ref 21–32)
CREATININE: 0.84 mg/dL (ref 0.60–1.30)
Calcium, Total: 6.6 mg/dL — CL (ref 8.5–10.1)
Chloride: 99 mmol/L (ref 98–107)
EGFR (African American): >60
EGFR (Non-African Amer.): >60
GLUCOSE: 109 mg/dL — AB (ref 65–99)
OSMOLALITY: 270 (ref 275–301)
Potassium: 3.5 mmol/L (ref 3.5–5.1)
Sodium: 134 mmol/L — ABNORMAL LOW (ref 136–145)

## 2014-05-14 LAB — MAGNESIUM: Magnesium: 1.8 mg/dL

## 2014-05-14 LAB — TRIGLYCERIDES: TRIGLYCERIDES: 1552 mg/dL — AB (ref 0–200)

## 2014-05-14 LAB — PHOSPHORUS: Phosphorus: 3.7 mg/dL (ref 2.5–4.9)

## 2014-05-15 LAB — CULTURE, BLOOD (SINGLE)

## 2014-05-16 LAB — COMPREHENSIVE METABOLIC PANEL
AST: 45 U/L — AB (ref 15–37)
Albumin: 1.7 g/dL — ABNORMAL LOW (ref 3.4–5.0)
Alkaline Phosphatase: 67 U/L
Anion Gap: 8 (ref 7–16)
BUN: 25 mg/dL — ABNORMAL HIGH (ref 7–18)
Bilirubin,Total: 0.3 mg/dL (ref 0.2–1.0)
CO2: 32 mmol/L (ref 21–32)
CREATININE: 0.93 mg/dL (ref 0.60–1.30)
Calcium, Total: 7.9 mg/dL — ABNORMAL LOW (ref 8.5–10.1)
Chloride: 99 mmol/L (ref 98–107)
EGFR (African American): >60
Glucose: 92 mg/dL (ref 65–99)
Osmolality: 282 (ref 275–301)
POTASSIUM: 3.6 mmol/L (ref 3.5–5.1)
SGPT (ALT): 37 U/L
Sodium: 139 mmol/L (ref 136–145)
Total Protein: 6.1 g/dL — ABNORMAL LOW (ref 6.4–8.2)

## 2014-05-16 LAB — CBC WITH DIFFERENTIAL/PLATELET
Basophil #: 0.1 10*3/uL (ref 0.0–0.1)
Basophil %: 0.9 %
EOS PCT: 8.1 %
Eosinophil #: 1.2 10*3/uL — ABNORMAL HIGH (ref 0.0–0.7)
HCT: 29.6 % — ABNORMAL LOW (ref 40.0–52.0)
HGB: 9.7 g/dL — ABNORMAL LOW (ref 13.0–18.0)
Lymphocyte #: 1.9 10*3/uL (ref 1.0–3.6)
Lymphocyte %: 13 %
MCH: 32.4 pg (ref 26.0–34.0)
MCHC: 32.8 g/dL (ref 32.0–36.0)
MCV: 99 fL (ref 80–100)
MONOS PCT: 6.1 %
Monocyte #: 0.9 x10 3/mm (ref 0.2–1.0)
NEUTROS ABS: 10.7 10*3/uL — AB (ref 1.4–6.5)
NEUTROS PCT: 71.9 %
Platelet: 345 10*3/uL (ref 150–440)
RBC: 3 10*6/uL — AB (ref 4.40–5.90)
RDW: 14.7 % — ABNORMAL HIGH (ref 11.5–14.5)
WBC: 14.9 10*3/uL — ABNORMAL HIGH (ref 3.8–10.6)

## 2014-05-16 LAB — TRIGLYCERIDES: Triglycerides: 219 mg/dL — ABNORMAL HIGH (ref 0–200)

## 2014-05-17 LAB — AMMONIA: Ammonia, Plasma: 57 mcmol/L — ABNORMAL HIGH (ref 11–32)

## 2014-05-18 LAB — CBC WITH DIFFERENTIAL/PLATELET
Basophil #: 0.1 10*3/uL (ref 0.0–0.1)
Basophil %: 1.5 %
EOS PCT: 8.8 %
Eosinophil #: 0.8 10*3/uL — ABNORMAL HIGH (ref 0.0–0.7)
HCT: 29.3 % — AB (ref 40.0–52.0)
HGB: 9.6 g/dL — ABNORMAL LOW (ref 13.0–18.0)
LYMPHS ABS: 1.9 10*3/uL (ref 1.0–3.6)
Lymphocyte %: 20.4 %
MCH: 32.6 pg (ref 26.0–34.0)
MCHC: 32.9 g/dL (ref 32.0–36.0)
MCV: 99 fL (ref 80–100)
Monocyte #: 0.8 x10 3/mm (ref 0.2–1.0)
Monocyte %: 8.8 %
Neutrophil #: 5.7 10*3/uL (ref 1.4–6.5)
Neutrophil %: 60.5 %
PLATELETS: 355 10*3/uL (ref 150–440)
RBC: 2.96 10*6/uL — ABNORMAL LOW (ref 4.40–5.90)
RDW: 14.9 % — ABNORMAL HIGH (ref 11.5–14.5)
WBC: 9.4 10*3/uL (ref 3.8–10.6)

## 2014-05-18 LAB — APTT: Activated PTT: 40.6 secs — ABNORMAL HIGH (ref 23.6–35.9)

## 2014-05-18 LAB — PROTIME-INR
INR: 1.1
PROTHROMBIN TIME: 14.1 s (ref 11.5–14.7)

## 2014-05-19 LAB — VANCOMYCIN, TROUGH: Vancomycin, Trough: 21 ug/mL (ref 10–20)

## 2014-05-20 LAB — CBC WITH DIFFERENTIAL/PLATELET
Basophil #: 0.1 10*3/uL (ref 0.0–0.1)
Basophil %: 1.1 %
EOS ABS: 1.4 10*3/uL — AB (ref 0.0–0.7)
EOS PCT: 14.3 %
HCT: 31.4 % — AB (ref 40.0–52.0)
HGB: 10.1 g/dL — ABNORMAL LOW (ref 13.0–18.0)
LYMPHS ABS: 2 10*3/uL (ref 1.0–3.6)
LYMPHS PCT: 20.9 %
MCH: 32 pg (ref 26.0–34.0)
MCHC: 32.3 g/dL (ref 32.0–36.0)
MCV: 99 fL (ref 80–100)
Monocyte #: 0.8 x10 3/mm (ref 0.2–1.0)
Monocyte %: 8 %
NEUTROS PCT: 55.7 %
Neutrophil #: 5.4 10*3/uL (ref 1.4–6.5)
Platelet: 467 10*3/uL — ABNORMAL HIGH (ref 150–440)
RBC: 3.17 10*6/uL — ABNORMAL LOW (ref 4.40–5.90)
RDW: 14.6 % — ABNORMAL HIGH (ref 11.5–14.5)
WBC: 9.7 10*3/uL (ref 3.8–10.6)

## 2014-05-20 LAB — BASIC METABOLIC PANEL
Anion Gap: 5 — ABNORMAL LOW (ref 7–16)
BUN: 13 mg/dL (ref 7–18)
CALCIUM: 8.2 mg/dL — AB (ref 8.5–10.1)
Chloride: 101 mmol/L (ref 98–107)
Co2: 35 mmol/L — ABNORMAL HIGH (ref 21–32)
Creatinine: 0.97 mg/dL (ref 0.60–1.30)
EGFR (African American): >60
EGFR (Non-African Amer.): >60
Glucose: 101 mg/dL — ABNORMAL HIGH (ref 65–99)
OSMOLALITY: 282 (ref 275–301)
Potassium: 3.8 mmol/L (ref 3.5–5.1)
Sodium: 141 mmol/L (ref 136–145)

## 2014-05-20 LAB — MAGNESIUM: MAGNESIUM: 2.2 mg/dL

## 2014-05-20 LAB — CULTURE, BLOOD (SINGLE)

## 2014-05-21 LAB — CBC WITH DIFFERENTIAL/PLATELET
BASOS ABS: 0.1 10*3/uL (ref 0.0–0.1)
Basophil %: 1.2 %
EOS ABS: 1.5 10*3/uL — AB (ref 0.0–0.7)
Eosinophil %: 13.7 %
HCT: 29.9 % — AB (ref 40.0–52.0)
HGB: 9.8 g/dL — AB (ref 13.0–18.0)
LYMPHS ABS: 2.2 10*3/uL (ref 1.0–3.6)
LYMPHS PCT: 20.7 %
MCH: 32.2 pg (ref 26.0–34.0)
MCHC: 32.6 g/dL (ref 32.0–36.0)
MCV: 99 fL (ref 80–100)
Monocyte #: 0.8 x10 3/mm (ref 0.2–1.0)
Monocyte %: 7.8 %
NEUTROS ABS: 6 10*3/uL (ref 1.4–6.5)
Neutrophil %: 56.6 %
PLATELETS: 579 10*3/uL — AB (ref 150–440)
RBC: 3.04 10*6/uL — ABNORMAL LOW (ref 4.40–5.90)
RDW: 14.5 % (ref 11.5–14.5)
WBC: 10.6 10*3/uL (ref 3.8–10.6)

## 2014-05-21 LAB — BASIC METABOLIC PANEL
Anion Gap: 4 — ABNORMAL LOW (ref 7–16)
BUN: 12 mg/dL (ref 7–18)
CALCIUM: 8.2 mg/dL — AB (ref 8.5–10.1)
CHLORIDE: 101 mmol/L (ref 98–107)
CREATININE: 1.07 mg/dL (ref 0.60–1.30)
Co2: 35 mmol/L — ABNORMAL HIGH (ref 21–32)
EGFR (African American): >60
EGFR (Non-African Amer.): >60
Glucose: 101 mg/dL — ABNORMAL HIGH (ref 65–99)
OSMOLALITY: 279 (ref 275–301)
POTASSIUM: 3.6 mmol/L (ref 3.5–5.1)
Sodium: 140 mmol/L (ref 136–145)

## 2014-05-21 LAB — VANCOMYCIN, TROUGH
Vancomycin, Trough: >50 ug/mL (ref 10–20)
Vancomycin, Trough: 18 ug/mL (ref 10–20)

## 2014-05-22 LAB — HEMOGLOBIN: HGB: 9.8 g/dL — AB (ref 13.0–18.0)

## 2014-05-23 LAB — VANCOMYCIN, TROUGH: VANCOMYCIN, TROUGH: 33 ug/mL — AB (ref 10–20)

## 2014-05-23 LAB — POTASSIUM
POTASSIUM: 3.2 mmol/L — AB (ref 3.5–5.1)
Potassium: 3.2 mmol/L — ABNORMAL LOW (ref 3.5–5.1)

## 2014-05-23 LAB — MAGNESIUM: MAGNESIUM: 1.9 mg/dL

## 2014-05-23 LAB — PHOSPHORUS: PHOSPHORUS: 4.5 mg/dL (ref 2.5–4.9)

## 2014-05-23 LAB — CREATININE, SERUM
Creatinine: 1.03 mg/dL (ref 0.60–1.30)
EGFR (African American): >60
EGFR (Non-African Amer.): >60

## 2014-05-24 LAB — CBC WITH DIFFERENTIAL/PLATELET
Basophil #: 0.1 10*3/uL (ref 0.0–0.1)
Basophil %: 0.6 %
Eosinophil #: 1.2 10*3/uL — ABNORMAL HIGH (ref 0.0–0.7)
Eosinophil %: 12.8 %
HCT: 33.6 % — ABNORMAL LOW (ref 40.0–52.0)
HGB: 10.7 g/dL — ABNORMAL LOW (ref 13.0–18.0)
Lymphocyte #: 1.9 10*3/uL (ref 1.0–3.6)
Lymphocyte %: 19.3 %
MCH: 31.5 pg (ref 26.0–34.0)
MCHC: 31.9 g/dL — ABNORMAL LOW (ref 32.0–36.0)
MCV: 99 fL (ref 80–100)
Monocyte #: 0.9 x10 3/mm (ref 0.2–1.0)
Monocyte %: 9.3 %
Neutrophil #: 5.6 10*3/uL (ref 1.4–6.5)
Neutrophil %: 58 %
Platelet: 768 10*3/uL — ABNORMAL HIGH (ref 150–440)
RBC: 3.41 10*6/uL — ABNORMAL LOW (ref 4.40–5.90)
RDW: 15 % — ABNORMAL HIGH (ref 11.5–14.5)
WBC: 9.7 10*3/uL (ref 3.8–10.6)

## 2014-05-24 LAB — PROTIME-INR
INR: 1.1
PROTHROMBIN TIME: 13.7 s (ref 11.5–14.7)

## 2014-05-24 LAB — BASIC METABOLIC PANEL
ANION GAP: 7 (ref 7–16)
BUN: 11 mg/dL (ref 7–18)
Calcium, Total: 7.5 mg/dL — ABNORMAL LOW (ref 8.5–10.1)
Chloride: 111 mmol/L — ABNORMAL HIGH (ref 98–107)
Co2: 27 mmol/L (ref 21–32)
Creatinine: 0.83 mg/dL (ref 0.60–1.30)
EGFR (African American): >60
EGFR (Non-African Amer.): >60
Glucose: 113 mg/dL — ABNORMAL HIGH (ref 65–99)
Osmolality: 289 (ref 275–301)
Potassium: 3.6 mmol/L (ref 3.5–5.1)
Sodium: 145 mmol/L (ref 136–145)

## 2014-05-24 LAB — VANCOMYCIN, TROUGH: VANCOMYCIN, TROUGH: 8 ug/mL — AB (ref 10–20)

## 2014-05-24 LAB — AMMONIA: Ammonia, Plasma: <10 mcmol/L (ref 11–32)

## 2014-05-24 LAB — POTASSIUM: Potassium: 4.4 mmol/L (ref 3.5–5.1)

## 2014-05-25 LAB — URINALYSIS, COMPLETE
Bacteria: NONE SEEN
Bilirubin,UR: NEGATIVE
Blood: NEGATIVE
GLUCOSE, UR: NEGATIVE mg/dL (ref 0–75)
Ketone: NEGATIVE
Leukocyte Esterase: NEGATIVE
NITRITE: NEGATIVE
Ph: 6 (ref 4.5–8.0)
Protein: NEGATIVE
RBC,UR: <1 /HPF (ref 0–5)
SPECIFIC GRAVITY: 1.018 (ref 1.003–1.030)
Squamous Epithelial: NONE SEEN
WBC UR: 2 /HPF (ref 0–5)

## 2014-05-25 LAB — PROTIME-INR
INR: 1
Prothrombin Time: 13.4 secs (ref 11.5–14.7)

## 2014-05-25 LAB — CLOSTRIDIUM DIFFICILE(ARMC)

## 2014-05-26 LAB — VANCOMYCIN, TROUGH: Vancomycin, Trough: 19 ug/mL (ref 10–20)

## 2014-05-26 LAB — PROTIME-INR
INR: 1.8
Prothrombin Time: 20.3 secs — ABNORMAL HIGH (ref 11.5–14.7)

## 2014-05-27 LAB — CBC WITH DIFFERENTIAL/PLATELET
BASOS ABS: 0.1 10*3/uL (ref 0.0–0.1)
Basophil %: 0.8 %
EOS ABS: 0.9 10*3/uL — AB (ref 0.0–0.7)
EOS PCT: 10.5 %
HCT: 31.6 % — AB (ref 40.0–52.0)
HGB: 10.2 g/dL — AB (ref 13.0–18.0)
LYMPHS ABS: 1.6 10*3/uL (ref 1.0–3.6)
Lymphocyte %: 19.8 %
MCH: 31.1 pg (ref 26.0–34.0)
MCHC: 32.3 g/dL (ref 32.0–36.0)
MCV: 97 fL (ref 80–100)
Monocyte #: 0.8 x10 3/mm (ref 0.2–1.0)
Monocyte %: 9.9 %
NEUTROS PCT: 59 %
Neutrophil #: 4.8 10*3/uL (ref 1.4–6.5)
Platelet: 606 10*3/uL — ABNORMAL HIGH (ref 150–440)
RBC: 3.27 10*6/uL — AB (ref 4.40–5.90)
RDW: 14.8 % — ABNORMAL HIGH (ref 11.5–14.5)
WBC: 8.2 10*3/uL (ref 3.8–10.6)

## 2014-05-27 LAB — BASIC METABOLIC PANEL
Anion Gap: 6 — ABNORMAL LOW (ref 7–16)
BUN: 14 mg/dL (ref 7–18)
CHLORIDE: 101 mmol/L (ref 98–107)
CO2: 32 mmol/L (ref 21–32)
Calcium, Total: 8.7 mg/dL (ref 8.5–10.1)
Creatinine: 0.91 mg/dL (ref 0.60–1.30)
EGFR (African American): >60
EGFR (Non-African Amer.): >60
Glucose: 110 mg/dL — ABNORMAL HIGH (ref 65–99)
Osmolality: 279 (ref 275–301)
Potassium: 3.6 mmol/L (ref 3.5–5.1)
Sodium: 139 mmol/L (ref 136–145)

## 2014-05-27 LAB — PROTIME-INR
INR: 2.6
Prothrombin Time: 27.1 secs — ABNORMAL HIGH (ref 11.5–14.7)

## 2014-05-27 LAB — URINE CULTURE

## 2014-05-27 LAB — HEMOGLOBIN A1C: Hemoglobin A1C: 5.5 % (ref 4.2–6.3)

## 2014-05-27 LAB — AMMONIA: Ammonia, Plasma: 28 mcmol/L (ref 11–32)

## 2014-05-28 LAB — PROTIME-INR
INR: 3.3
Prothrombin Time: 32.3 secs — ABNORMAL HIGH (ref 11.5–14.7)

## 2014-05-28 LAB — VANCOMYCIN, TROUGH: Vancomycin, Trough: 17 ug/mL (ref 10–20)

## 2014-05-29 LAB — URINALYSIS, COMPLETE
BACTERIA: NONE SEEN
Bilirubin,UR: NEGATIVE
GLUCOSE, UR: NEGATIVE mg/dL (ref 0–75)
Ketone: NEGATIVE
LEUKOCYTE ESTERASE: NEGATIVE
NITRITE: NEGATIVE
PROTEIN: NEGATIVE
Ph: 6 (ref 4.5–8.0)
RBC,UR: 43 /HPF (ref 0–5)
SQUAMOUS EPITHELIAL: NONE SEEN
Specific Gravity: 1.016 (ref 1.003–1.030)
WBC UR: 5 /HPF (ref 0–5)

## 2014-05-29 LAB — PROTIME-INR
INR: 1.8
Prothrombin Time: 20.1 secs — ABNORMAL HIGH (ref 11.5–14.7)

## 2014-05-29 LAB — CK: CK, Total: 32 U/L — ABNORMAL LOW (ref 39–308)

## 2014-05-29 LAB — LACTATE DEHYDROGENASE: LDH: 204 U/L (ref 85–241)

## 2014-05-30 LAB — CBC WITH DIFFERENTIAL/PLATELET
BASOS ABS: 0.1 10*3/uL (ref 0.0–0.1)
BASOS PCT: 1.1 %
EOS PCT: 6.7 %
Eosinophil #: 0.8 10*3/uL — ABNORMAL HIGH (ref 0.0–0.7)
HCT: 32.3 % — ABNORMAL LOW (ref 40.0–52.0)
HGB: 10.7 g/dL — ABNORMAL LOW (ref 13.0–18.0)
LYMPHS ABS: 1.8 10*3/uL (ref 1.0–3.6)
Lymphocyte %: 15 %
MCH: 31.3 pg (ref 26.0–34.0)
MCHC: 32.9 g/dL (ref 32.0–36.0)
MCV: 95 fL (ref 80–100)
MONO ABS: 1.3 x10 3/mm — AB (ref 0.2–1.0)
Monocyte %: 10.4 %
NEUTROS ABS: 8.1 10*3/uL — AB (ref 1.4–6.5)
Neutrophil %: 66.8 %
Platelet: 506 10*3/uL — ABNORMAL HIGH (ref 150–440)
RBC: 3.4 10*6/uL — AB (ref 4.40–5.90)
RDW: 14.9 % — ABNORMAL HIGH (ref 11.5–14.5)
WBC: 12.2 10*3/uL — AB (ref 3.8–10.6)

## 2014-05-30 LAB — BASIC METABOLIC PANEL
Anion Gap: 4 — ABNORMAL LOW (ref 7–16)
BUN: 19 mg/dL — AB (ref 7–18)
CALCIUM: 8.9 mg/dL (ref 8.5–10.1)
CHLORIDE: 99 mmol/L (ref 98–107)
CREATININE: 1.19 mg/dL (ref 0.60–1.30)
Co2: 32 mmol/L (ref 21–32)
EGFR (African American): >60
EGFR (Non-African Amer.): >60
GLUCOSE: 142 mg/dL — AB (ref 65–99)
Osmolality: 275 (ref 275–301)
Potassium: 3.7 mmol/L (ref 3.5–5.1)
SODIUM: 135 mmol/L — AB (ref 136–145)

## 2014-05-30 LAB — PROTIME-INR
INR: 1.8
Prothrombin Time: 20.5 secs — ABNORMAL HIGH (ref 11.5–14.7)

## 2014-05-30 LAB — URINE CULTURE

## 2014-05-31 LAB — PROTIME-INR
INR: 2.4
Prothrombin Time: 25.5 secs — ABNORMAL HIGH (ref 11.5–14.7)

## 2014-06-01 ENCOUNTER — Ambulatory Visit (HOSPITAL_COMMUNITY)
Admission: EM | Admit: 2014-06-01 | Discharge: 2014-06-01 | Disposition: A | Discharge status: Home / Self Care | Payer: BC Managed Care – PPO | Source: Other Acute Inpatient Hospital | Attending: Internal Medicine | Admitting: Internal Medicine

## 2014-06-01 DIAGNOSIS — J9690 Respiratory failure, unspecified, unspecified whether with hypoxia or hypercapnia: Secondary | ICD-10-CM | POA: Insufficient documentation

## 2014-06-01 LAB — BASIC METABOLIC PANEL
Anion Gap: 3 — ABNORMAL LOW (ref 7–16)
BUN: 17 mg/dL (ref 7–18)
CREATININE: 1.15 mg/dL (ref 0.60–1.30)
Calcium, Total: 8.5 mg/dL (ref 8.5–10.1)
Chloride: 98 mmol/L (ref 98–107)
Co2: 33 mmol/L — ABNORMAL HIGH (ref 21–32)
EGFR (African American): >60
EGFR (Non-African Amer.): >60
Glucose: 129 mg/dL — ABNORMAL HIGH (ref 65–99)
Osmolality: 271 (ref 275–301)
POTASSIUM: 3.7 mmol/L (ref 3.5–5.1)
Sodium: 134 mmol/L — ABNORMAL LOW (ref 136–145)

## 2014-06-01 LAB — PROTIME-INR
INR: 3.1
Prothrombin Time: 31.1 secs — ABNORMAL HIGH (ref 11.5–14.7)

## 2014-06-01 LAB — CBC WITH DIFFERENTIAL/PLATELET
BASOS ABS: 0.1 10*3/uL (ref 0.0–0.1)
Basophil %: 1.3 %
EOS ABS: 1.3 10*3/uL — AB (ref 0.0–0.7)
Eosinophil %: 13.7 %
HCT: 31.1 % — AB (ref 40.0–52.0)
HGB: 10 g/dL — AB (ref 13.0–18.0)
Lymphocyte #: 1.6 10*3/uL (ref 1.0–3.6)
Lymphocyte %: 16.8 %
MCH: 30.8 pg (ref 26.0–34.0)
MCHC: 32.3 g/dL (ref 32.0–36.0)
MCV: 96 fL (ref 80–100)
MONOS PCT: 8.8 %
Monocyte #: 0.8 x10 3/mm (ref 0.2–1.0)
NEUTROS PCT: 59.4 %
Neutrophil #: 5.6 10*3/uL (ref 1.4–6.5)
Platelet: 467 10*3/uL — ABNORMAL HIGH (ref 150–440)
RBC: 3.26 10*6/uL — ABNORMAL LOW (ref 4.40–5.90)
RDW: 15.3 % — ABNORMAL HIGH (ref 11.5–14.5)
WBC: 9.3 10*3/uL (ref 3.8–10.6)

## 2014-06-01 LAB — PHOSPHORUS: Phosphorus: 4.7 mg/dL (ref 2.5–4.9)

## 2014-06-01 LAB — MAGNESIUM: MAGNESIUM: 2.1 mg/dL

## 2014-06-03 LAB — CULTURE, BLOOD (SINGLE)

## 2014-06-08 LAB — EXPECTORATED SPUTUM ASSESSMENT W GRAM STAIN, RFLX TO RESP C

## 2014-10-01 NOTE — Consult Note (Signed)
Psychiatry: Follow-up for this patient with resolving delirium.  They have been able to take him off the fentanyl drip today which is quite a bit of progress.  He still gets a little agitated but has been manageable.  Still on his Precedex drip.  Still on IV regular Valium but off of the Haldol.  Getting Seroquel. was calm when I came into the room.  Still asleep.  I did not try to wake him. change to medicine.  Continue plans to try and wean him gently off the Precedex.  Continue this higher dose of methadone which seems to be tolerated and may have helped him get off the fentanyl  Electronic Signatures: Clapacs, Jackquline DenmarkJohn T (MD)  (Signed on 18-Dec-15 17:44)  Authored  Last Updated: 18-Dec-15 17:44 by Audery Amellapacs, John T (MD)

## 2014-10-01 NOTE — Discharge Summary (Signed)
PATIENT NAME:  Edward Durham, Edward Durham MR#:  [id number] DATE OF BIRTH:  1965/07/22  DATE OF ADMISSION:  04/25/2014 DATE OF DISCHARGE:  06/01/2014   STAT TRANSFER SUMMARY  DISPOSITION: The patient is now being transferred to a long-term acute care facility on 06/01/2014.   For a detailed note, please take a look at the history and physical done on admission by Dr. Alford Highland. Please also look at the multiple interim discharge summaries done by Dr. Elisabeth Pigeon on 05/01/2014, by Dr. Elpidio Anis on 05/08/2014, by Dr. Elisabeth Pigeon again by 05/13/2014, By Dr. Winona Legato on 05/27/2014, and also again Dr. Elpidio Anis on 05/20/2014. This is just a short interim course, which covers hospital course from 05/28/2014 until 06/01/2014.   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   CONSULTANTS DURING THE HOSPITAL COURSE: Audery Amel, MD from psychiatry, Stann Mainland. Sampson Goon, MD from infectious disease, Dory Larsen, MD from pulmonary/critical care, Dow Adolph, MD from gastroenterology, Yevonne Pax, MD from pulmonary/critical care.   CURRENT MEDICATIONS: As follows: Precedex drip, Tylenol 650 every 4 hours as needed, insulin sliding scale, aspirin 81 mg via PEG tube daily, hydralazine 10 mg IV q. 6 hours as needed for systolic blood pressures greater than 150, gabapentin 100 mg q. 8 hours, fentanyl patch 100 mg q. 72 hours, Geodon 20 mg every 12 hours  intramuscularly, Protonix 40 mg via G-tube daily, vancomycin 750 mg IV every 8 hours, Flomax 0.4 mg daily, lactobacillus 1 capsule daily, loperamide 2 mg q. 4 hours as needed, Seroquel 200 mg every 12 hours via PEG tube, Dilaudid 4 mg IV q. 1 hour p.r.n. agitation, Diflucan 100 mg daily, ibuprofen 600 mg q. 6 hours as needed for fever, Valium 15 mg via G-tube q. 6 hours, Risperdal 1 mg at bedtime, methadone 35 mg q. 8 hours via PEG tube, ceftazidime 2 mg q. 8 hours, and Coumadin 2.5 mg daily.   BRIEF INTERIM HOSPITAL COURSE: This is a 49 year old male with medical problems as mentioned above, who presented to the hospital unresponsive after signing out Against Medical Advice and was admitted for sepsis secondary to pneumonia and respiratory failure.  1.  MRSA pneumonia. This was bilateral pneumonia. The patient was initially intubated and then extubated. He had to be reintubated twice. The patient has had a tracheostomy placed now since 05/16/2014 and is tolerating that well. He still continues to have seen significant increasing secretions through his trach. He had a bronchoscopy with bronchial alveolar lavage done on 05/10/2014. The sputum culture grew out MRSA. He is still being continued on IV vancomycin. Due to his increased secretions, he had a repeat sputum culture done on 05/29/2014, which is growing Klebsiella, Pseudomonas. The patient therefore has been started on ceftazidime along with the vancomycin as mentioned.  2.  Acute respiratory failure with hypoxia and hypercarbia. This was secondary to COPD and also pneumonia. During the early part of the hospital course, the patient was intubated and failed weaning multiple times. He had to have a tracheostomy placed and is currently on a trach collar at 50% FiO2. He is stable. He is  still requiring aggressive pulmonary due to high increasing secretions. He is currently on IV vancomycin for the MRSA in the sputum, and also on IV ceftazidime, which was just  started today for the Pseudomonas and Klebsiella in the sputum. This further needs to be followed.  3.  Acute metabolic encephalopathy. This was secondary to his severe hypercarbia; also secondary to withdrawal from polysubstance abuse. The patient apparently had multiple episodes of agitation and was violent at times. He had to be reintubated multiple times as mentioned. At present, the patient has been maintained on high-dose methadone, along with IV Dilaudid, Valium and Ativan. Multiple attempts have been made to wean him off the Precedex drip, but it has been unsuccessful, as each time we try to wean him off the Precedex, he gets quite combative and agitated. He likely needs to be titrated down on his medications and weaned off the Precedex drip eventually. He possibly may benefit from a psychiatric consult.  4.  Fever of unknown origin. The patient in this past interim week has had fevers as high as 101 to 102. The most likely source is probably respiratory in nature. His sputum cultures as mentioned have now grown out Klebsiella and Pseudomonas. He is currently being treated with IV ceftazidime and also IV vancomycin. His repeat blood cultures from 05/29/2014 are negative. Urine cultures are negative. His fever curve has improved with some Tylenol, and since he has been on IV antibiotics. He was also noted to have some oral thrush, for which he is currently getting Diflucan. Infectious disease still continues to follow this patient.  5.  Elevated troponin. This was likely second to demand ischemia. He has had no acute issues and has had no acute chest pain.  6.  Anemia of chronic disease. The patient's hemoglobin has remained stable. He has not required any transfusions in the interim hospital course.  7.  He has a right upper extremity deep vein thrombosis. The patient is on Coumadin. His INR presently is 3.1.   PHYSICAL EXAMINATION: His Coumadin dose further needs to be adjusted based on his INR.  Given the patient's prolonged hospital course and chronicity of he problems, he would benefit from a long-term acute care facility, which is where he is presently being transferred to.   DISPOSITION: The patient is being discharged to [facility].   TIME SPENT: 40 minutes.    ____________________________ Rolly PancakeVivek J. Cherlynn KaiserSainani, MD vjs:MT D: 06/01/2014 14:46:26 ET T: 06/01/2014 15:07:01 ET JOB#: [id number]  cc: Rolly PancakeVivek J. Cherlynn KaiserSainani, MD, <Dictator> Houston SirenVIVEK J SAINANI MD ELECTRONICALLY SIGNED 06/09/2014 10:46

## 2014-10-01 NOTE — Consult Note (Signed)
Psychiatry: Follow up. Nursing tells me that after bronch today it appears unlikely that patient will be extubated soon. Ativan drip was tapered off with no negative consequences so far. I will sign off unless there is a new concern in which I can help. Thanks.  Electronic Signatures: Clapacs, Jackquline DenmarkJohn T (MD)  (Signed on 01-Dec-15 19:35)  Authored  Last Updated: 01-Dec-15 19:35 by Audery Amellapacs, John T (MD)

## 2014-10-01 NOTE — Consult Note (Signed)
Psychiatry: Follow-up for this patient with persistent delirium and difficulty weaning off of drips.  Patient continues to have the same pattern of behavior he has had for several days.  Any time the fentanyl or Precedex drips are decreased he becomes agitated and difficult to manage.  This is despite multiple doses of sedating medicine and antipsychotics throughout the day. discussed with hospitalist this morning.  She has requested a neurology consult results are still pending. case with nursing today.  Trying to find some way to alter something that might help with this.  I'm going to increase his Seroquel from 100 mg to 200 mg twice a day and I'm also going to increase his methadone to 45 mg twice a day.  Given that some of the underlying problem was initially opiate dependence possibly increasing the methadone may make it easier to get some of the fentanyl drip discontinued. change to diagnosis will continue to follow.  Electronic Signatures: Tahjae Durr, Jackquline DenmarkJohn T (MD)  (Signed on 17-Dec-15 18:46)  Authored  Last Updated: 17-Dec-15 18:46 by Audery Amellapacs, Idona Stach T (MD)

## 2014-10-01 NOTE — Consult Note (Signed)
Details:   - GI Note:  PEG site c/d/i  loosened to 2.5 cm.    -OK to start tube feeds - apply bacitracin topically daily to skin around PEG site for 5 days.  WIll sign off.  Call with q.   Electronic Signatures: Dow Adolphein, Myca Perno (MD)  (Signed 11-Dec-15 16:29)  Authored: Details   Last Updated: 11-Dec-15 16:29 by Dow Adolphein, Diella Gillingham (MD)

## 2014-10-01 NOTE — Consult Note (Signed)
PATIENT NAME:  Edward Durham, Edward Durham DATE OF BIRTH:  04-03-1966  DATE OF CONSULTATION:  05/13/2014  CONSULTING PHYSICIAN:  Christena DeemMartin U. Skulskie, MD  REASON FOR CONSULTATION: New diagnosis of hepatitis C.   HISTORY OF PRESENT ILLNESS: Edward Durham is a 49 year old, Caucasian male who initially presented to the emergency room with pneumonia on 04/24/2014. He was admitted at that time, however, went AMA later in the day. He was then readmitted on 04/25/2014 with respiratory failure and sepsis. Over the course of this hospitalization has been complicated with failure to extubate due to combative behavior, possible substance withdrawal/encephalopathy. He has a history of COPD/MRSA pneumonia, sepsis. He has a history of self extubation on 04/28/2014 with reintubation on 04/29/2014. There was an attempt to extubate on 05/04/2014, however, the patient was not extubated due to combativeness and need to manage that issue. At that time, there was a need to check him for hepatitis due to an employee exposure. This did return positive, I believe, for an antibody study. This [facility] [employer] [facility] [facility]. I have that attempted both yesterday and today to obtain the actual lab tests, but they are not available and [employer] [facility] is closed today. I have received information through nursing supervisor that he was positive for hepatitis C. I believe this was only an antibody study. He was negative for hepatitis B and negative for HIV. On 11/14, laboratories had been obtained showing an AST and ALT 72 and 90 respectively, albumin 3.3. His INR was 1.1. He did have a drug screen positive for opiates, methadone and benzodiazepines.   On 12/04, repeat LFTs showed an AST of 52, ALT 37 and albumin 1.3. He has had no imaging of the liver. He is currently on enteral feeds via NG tube. I was able to contact the patient's wife earlier today for further history. The patient currently is sedated and on the ventilator and  unable to respond. Further history obtained from his wife in regards to hepatitis C risk factors: The patient does have multiple tattoos, initially placed over 20 years ago and the last ones about 4 to 5 years ago. He has never had a known history of hepatitis or partners with hepatitis; however, he had some recent heroin use and one of his friends is known to have hepatitis C. He has no military history. There is no significant incarceration history. He has had no foreign travel. 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 He has a history of chronic neck pain, for which he does take narcotics. He has a history of crack cocaine use about 10 years ago. Recent IV heroin use is noted. 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 He has never had a transfusion. He has never had a hospitalization for depression, although he has undergone detox on at least 1 occasion.   PAST MEDICAL HISTORY: History of osteoarthritis, chronic back pain, depression right thumb amputation, back surgery, neck surgery in 2013 and a left heel surgery.   OUTPATIENT MEDICATIONS: Have included Celexa 40 mg once a day, methadone 35 mg once a day, omeprazole 20 mg once a day, trazodone 50 mg once a day.   ALLERGIES: HE IS ALLERGIC TO MORPHINE.   LABORATORY, DIAGNOSTIC AND RADIOLOGICAL DATA: Relevant as noted. Further labs as follows: Today he had a glucose of 104, BUN 16, creatinine 1.02, sodium 141, potassium 3.8, chloride 100, bicarbonate 37, calcium 7.8. Hepatic profile: Total protein 6.1, albumin 1.7, total bilirubin  0.3, alkaline phosphatase 85, AST 52, ALT 37. Hemogram showing a white count of 11.1, hemoglobin and hematocrit 9.6/30.4, platelet count 393,000, MCV 102. He has had no abdominal imaging. He shows progressive bilateral pulmonary alveolar infiltrates related to possibly pulmonary edema and/or pneumonia on repeat chest films.   PHYSICAL EXAMINATION: VITAL SIGNS: Temperature 101.5, pulse 111,  respirations 14, blood pressure 118/64, pulse oximetry 95%.   GENERAL: The patient is sedated on a ventilator, unable to respond.   HEENT: Normocephalic, atraumatic.   EYES: Anicteric.   NOSE: Septum midline.   OROPHARYNX: ET tube in place.   NECK: No JVD.  HEART: Tachycardia without rub or gallop.   LUNGS: Bilateral occasional coarse rhonchi.   ABDOMEN: Minimally distended/soft. Bowel sounds are positive.   EXTREMITIES: No clubbing, cyanosis or edema.   NEUROLOGICAL: Unable to assess due to sedation.   ASSESSMENT: New diagnosis of hepatitis C in the setting of both remote and recent drug use. More recently, intravenous drug use with a known acquaintance with hepatitis C. He also has other risk factors for hepatitis C including tattoos going back to 20 years ago. The laboratory obtained was apparently an antibiotics study. I have not been able to obtain a copy of the actual study due to this having been done through [facility] and currently not in the computer or in the patient's chart; however, I doubt that there has been a confirmatory test done by RNA/PCR.   RECOMMENDATIONS: 1. Will obtain a hepatitis C virus RNA by PCR. Up to 15% of patients with a positive hepatitis C virus antibody will actually be negative for active hepatitis C due to the antibody being non-neutralizing. Confirmatory testing by RNA as noted.  2. Will obtain abdominal imaging with ultrasound initially.  3. The patient is not currently a candidate for treatment of the hepatitis C. This would be something that would be re-evaluated as an outpatient, particularly in light of recent ongoing IV drug use and ongoing alcohol abuse/use.  4. Patient also is continuing on ventilation and sedation and will likely need longer term placement with tracheostomy and PEG tube placement. I have noted plans for tracheoscopy this coming week. We will also arrange for PEG placement this coming week once the tracheostomy is done.   5. We will follow with you.   Thank you for this consult. Dr. Shelle Iron will be available this weekend if needed. I will be back on Monday.    ____________________________ Christena Deem, MD mus:TT D: 05/13/2014 17:32:49 ET T: 05/13/2014 20:36:17 ET JOB#: [id number]  cc: Christena Deem, MD, <Dictator> Christena Deem MD ELECTRONICALLY SIGNED 05/31/2014 1:43

## 2014-10-01 NOTE — Consult Note (Signed)
Brief Consult Note: Diagnosis: new diagnosis hepatitis c.   Patient was seen by consultant.   Recommend further assessment or treatment.   Discussed with Attending MD.   Comments: Patietn seen and examined, full consult to follow.  Patietn tested for hepatitis C after employee exposure.  Patietn currently on vent and not responsive.  I had an extensive concersation with patients wife in regard to history.  There are several risk factors-tattoos, IVDA, possible tfx, other drug use.  The test done by employee health is not available in the computer and I am trying to get the specifics on the test.  I will need to obtain a HVC RNA and genotype by pcr if not done as a confirmatory test already, recommend abdominal US.   Following.  Electronic Signatures: Barnetta ChapelSkulskie, Martin (MD)  (Signed 04-Dec-15 08:39)  Authored: Brief Consult Note   Last Updated: 04-Dec-15 08:39 by Barnetta ChapelSkulskie, Martin (MD)

## 2014-10-01 NOTE — Consult Note (Signed)
PATIENT NAME:  Edward Durham, Edward Durham DATE OF BIRTH:  May 03, 1966  DATE OF CONSULTATION:  04/29/2014  REFERRING PHYSICIAN:   CONSULTING PHYSICIAN:  Edward Salvageichard E. Excell Seltzerooper, MD  CHIEF COMPLAINT: Abnormal CT scan.   HISTORY OF PRESENT ILLNESS: This is a patient who is intubated. I spoke directly to internal medicine and [facility] concerning this patient's care. I was asked to see the patient for an abnormal CT scan suggesting proctitis. His history is thoroughly reviewed in the chart and well documented by other providers. No history is obtainable from the patient, as he is currently on a ventilator. He is not paralyzed, but is on propofol and fentanyl and unresponsive, but again not paralyzed.  History is obtained from the chart.  Recently noted is that a gram-negative rod that was believed to be growing in blood was actually mislabeled and was from another patient. He does have a gram-positive sepsis, which is currently being treated and supported. I was asked to review the patient's CT scan, which suggested proctitis and retroperitoneal edema. When I spoke to [facility], the history was that he was having bowel movements and having no abdominal pain prior to intubation and reintubation for respiratory failure.   PAST MEDICAL HISTORY:  Depression, osteoarthritis, and chronic back pain.   PAST SURGICAL HISTORY:  Right thumb amputation, back surgery, and neck surgery.  SOCIAL HISTORY:  Also of note, the patient has had a history of being on a methadone program, not clear if he was using IV drugs. That is not available to me at this point.  REVIEW OF SYSTEMS:  Not obtainable.   MEDICATIONS: Multiple, see chart.   ALLERGIES: TO MORPHINE.   No other history obtainable.   PHYSICAL EXAMINATION: GENERAL:  The patient is resting comfortably on a ventilator. VITAL SIGNS:  He is afebrile with a T-Max 99.3, heart rate is 68, respirations 16, blood pressure 96/68.  CHEST:  Clear to auscultation.   CARDIAC:  Regular rate and rhythm.  ABDOMEN:  Slightly distended, but soft, completely nontender. He does not react to deep palpation nor to any signs of peritoneal irritation, completely lacking. Calves are nontender. RECTAL: Normal. No blood, brown stool, formed.  LABORATORY DATA:  CT scan is personally reviewed. There appears to be a thickened rectum and not much else in terms of pathology.  White blood cell count is normal yesterday, none today. It was 6.0 yesterday. H and H is 10.6 and 32 with a platelet count of 238,000.   Electrolytes today are within normal limits. Calcium depressed at 7.4.   ASSESSMENT AND PLAN:  CT scan has been personally reviewed. I see some signs of proctitis. I would suggest checking the patient for Clostridium difficile toxin and I agree with Dr. Sampson GoonFitzgerald that with his past history, that HIV testing might be of value as well. At the very least, once he wakes up and is off the ventilator, a flexible sigmoidoscopy could be performed by GI or a rigid proctoscopy could be performed at bedside as well, but at this point, I see no signs of intra-abdominal process and the gram-negative rod that was previously identified is actually not on this patient, not on [name]. Therefore gram-positive sepsis possibly related to respiratory failure is congruent with his current condition. I would be happy to follow the patient while he is in the hospital.     ____________________________ Edward Salvageichard E. Excell Seltzerooper, MD rec:sw D: 04/29/2014 17:28:36 ET T: 04/29/2014 17:44:09 ET JOB#: [id number]  cc: Gerlene Burdockichard  Kerby Nora, MD, <Dictator> Lattie Haw MD ELECTRONICALLY SIGNED 04/30/2014 10:28

## 2014-10-01 NOTE — Op Note (Signed)
PATIENT NAME:  Edward Durham, Tuck V MR#:  [id number] DATE OF BIRTH:  [birth year]  DATE OF PROCEDURE:  05/19/2014  PROCEDURE PERFORMED:  Percutaneous endoscopic gastrostomy (PEG).  Performed in conjunction with Dr Barnetta ChapelMartin Skulskie.   PROCEDURE NOTE: After Dr. Marva PandaSkulskie intubated the esophagus and entered the stomach with the upper endoscope, excellent transillumination and finger indentation were obtained. I then prepped the skin and draped in a sterile fashion. I then injected 1% lidocaine to numb the area. I then made a small incision with a scalpel. An introducer catheter was then inserted through the abdominal wall and into the stomach. Visualization was confirmed endoscopically. A wire was then passed through the  introducer,  was grabbed with a snare through the endoscope, and was pulled out through the mouth. A feeding tube was then attached to the wire and pulled back down through the mouth, back up against the wall of the stomach. The external position on the PEG at that point was 2 cm. An external bumper was placed. The drape was then removed and bacitracin ointment was applied underneath the external bumper.   ESTIMATED BLOOD LOSS:  5 mL.  The patient was returned to the ICU in good condition.     ____________________________ Dow AdolphMatthew Rein, MD mr:mw D: 05/24/2014 15:22:00 ET T: 05/24/2014 15:59:39 ET JOB#: [id number]  cc: Dow AdolphMatthew Rein, MD, <Dictator> Kathalene FramesMATTHEW G REIN MD ELECTRONICALLY SIGNED 05/24/2014 17:01

## 2014-10-01 NOTE — Consult Note (Signed)
Brief Consult Note: Diagnosis: delirium due to multiple factors in ICU setting/ opiate abuse by history.   Patient was seen by consultant.   Consult note dictated.   Comments: Psychiatry: PAtient seen, chart reviewed, case discussed with nursing. Patient has become agitated with extubation. Planned extubation tomorrow. Full note done but I do not have a lot to add. Suggest maybe when extubating that propofol be decreased rather than ativan or fentanyl as much as possible and that haldol 2mg  iv be given shortly before extubation.  Electronic Signatures: Audery Amellapacs, Emmie Frakes T (MD)  (Signed [phone]-Nov-15 15:39)  Authored: Brief Consult Note   Last Updated: 30-Nov-15 15:39 by Audery Amellapacs, Rozann Holts T (MD)

## 2014-10-01 NOTE — H&P (Signed)
PATIENT NAME:  Edward Durham, Edward V MR#:  [id number] DATE OF BIRTH:  04-10-1966  DATE OF ADMISSION:  04/24/2014  REFERRING PHYSICIAN:  Dr. Derrill KayGoodman  PRIMARY CARE PHYSICIAN:  Dr. Carlynn PurlSowles  CHIEF COMPLAINT: Cough, shortness of breath.  HISTORY OF PRESENT ILLNESS: Edward Durham is a 49 year old male with history of chest pain and was recently started on methadone program. Comes to the Emergency Department with complaints of cough, shortness of breath for the last 3 to 4 days. Has been having cough with a productive sputum of brownish sputum.  In the ER, the patient was found to have fever of 102.  Was hypoxic as oxygen saturation in 80s. The patient was started on 2 liters of oxygen through nasal cannula. Does not have any elevated white blood cell count. Chest x-ray did not show any obvious infiltrates.  The patient was diffusely wheezing at the time of the presentation. Received multiple breathing treatments in the Emergency Department.   PAST MEDICAL HISTORY:   1. Depression.  2. Osteoarthritis. 3. Chronic back pain.  PAST SURGICAL HISTORY:  1. Right thumb amputation. 2. Back surgery. 3. Neck surgery in 2013. 4. Left heel surgery.  ALLERGIES: MORPHINE.  HOME MEDICATIONS:  1. Trazodone 50 mg at bedtime. 2. Omeprazole 20 mg daily. 3. Methadone 35 mg daily. 4. Celexa 40 mg daily.  SOCIAL HISTORY: Continues to smoke 1 pack a day.  Drinks alcohol occasionally.  Was using pain medications bought from the street, but currently a methadone program.  FAMILY HISTORY: Mother died of a heart attack in her 5650.  Father died of some lung problems, used to have a tracheostomy.  REVIEW OF SYSTEMS:  CONSTITUTIONAL:  Experiencing generalized weakness.   EYES: No change in vision. ENT: No change in hearing. RESPIRATORY: Has cough, shortness of breath. CARDIOVASCULAR: No chest pain, palpitations. GASTROINTESTINAL: Has nausea and vomiting. No diarrhea. GENITOURINARY: No dysuria or hematuria. HEMATOLOGIC:   No easy bruising or bleeding. SKIN:  No rash or lesions. MUSCULOSKELETAL: No joint pain or aches. NEUROLOGIC:  No weakness or numbness in the part of the body.  PHYSICAL EXAMINATION:  GENERAL: This is a well-built, well-developed, (Dictation Anomaly)<<MISSING TEXT>> appropriate male laying down in the bed (Dictation Anomaly) <<MISSING TEXT>>  VITAL SIGNS: Temperature 102.1, pulse 100, blood pressure 126/62, respiratory rate of 22, oxygen saturation 90% on 2 liters of oxygen. HEENT:  Head normocephalic, atraumatic, No scleral icterus. Conjunctivae normal. Pupils equal and reactive to light. Mucous membranes moist. (Dictation Anomaly) <<MISSING TEXT>> lymphadenopathy. No JVD. No carotid (Dictation Anomaly) <<MISSING TEXT>> .  CHEST: No focal tenderness.   LUNGS: Bilateral diffuse wheezing. Coarse breath sounds in the left lower lobe and some crackles. HEART:  S1, S2 regular.  No murmurs are heard. ABDOMEN: Bowel sounds present, soft, nontender, nondistended. No hepatosplenomegaly. EXTREMITIES:  (Dictation Anomaly) <<MISSING TEXT>> pulses, 2+. SKIN: No rashes or lesions. MUSCULOSKELETAL: Good range of motion in all extremities. NEUROLOGIC: The patient is alert, oriented to place, person and time. Cranial nerves II through XII intact.  Motor 5/5 in upper and lower extremities.  LABORATORY DATA:   CMP: potassium 3.4, (Dictation Anomaly) <<MISSING TEXT>> within normal limits. CBC: WBC of 7.7, hemoglobin 12.2, platelet count of 274,000. Urine negative for nitrites and leukocyte esterase.  Chest x-ray: One view, portable. No active disease of the chest.  ASSESSMENT AND PLAN: Edward Durham, a 49 year old male, comes with pneumonia, chronic obstructive pulmonary disease exacerbation.  1. Pneumonia. Even the chest x-ray does not show any obvious infiltrates.  Has crackles  in the left lower lobe.  Will treat as a community-acquired pneumonia with Rocephin and Zithromax. Has brownish sputum.  Has a  high-grade fever. 2. Chronic obstructive pulmonary disease exacerbation.   Continue Duo-nebs and Solu-Medrol. 3. Tobacco use. Consulted the patient. 4. Chronic pain. The patient has been on methadone program.  Will continue with 1 dose of the methadone. 5. Keep the patient on DVT prophylaxis with Lovenox.  TIME SPENT:  50 minutes.       ____________________________ Nancy Marus, MD dek:dw D: 04/24/2014 03:00:39 ET T: 04/24/2014 07:04:56 ET JOB#: [id number]  cc: Nancy Marus, MD, <Dictator> Onnie Boer. Carlynn Purl, MD

## 2014-10-01 NOTE — H&P (Signed)
PATIENT NAME:  Edward Durham, Kieon V MR#:  [id number] DATE OF BIRTH:  12-Sep-1965  DATE OF ADMISSION:  04/24/2014  REFERRING PHYSICIAN:  Dr. Derrill KayGoodman  PRIMARY CARE PHYSICIAN:  Dr. Carlynn PurlSowles  CHIEF COMPLAINT: Cough, shortness of breath.  HISTORY OF PRESENT ILLNESS: Edward Durham is a 49 year old male with history of chest pain and was recently started on methadone program. Comes to the Emergency Department with complaints of cough, shortness of breath for the last 3 to 4 days. Has been having cough with a productive sputum of brownish sputum.  In the ER, the patient was found to have fever of 102.  Was hypoxic as oxygen saturation in 80s. The patient was started on 2 liters of oxygen through nasal cannula. Does not have any elevated white blood cell count. Chest x-ray did not show any obvious infiltrates.  The patient was diffusely wheezing at the time of the presentation. Received multiple breathing treatments in the Emergency Department.   PAST MEDICAL HISTORY:   1. Depression.  2. Osteoarthritis. 3. Chronic back pain.  PAST SURGICAL HISTORY:  1. Right thumb amputation. 2. Back surgery. 3. Neck surgery in 2013. 4. Left heel surgery.  ALLERGIES: MORPHINE.  HOME MEDICATIONS:  1. Trazodone 50 mg at bedtime. 2. Omeprazole 20 mg daily. 3. Methadone 35 mg daily. 4. Celexa 40 mg daily.  SOCIAL HISTORY: Continues to smoke 1 pack a day.  Drinks alcohol occasionally.  Was using pain medications bought from the street, but currently a methadone program.  FAMILY HISTORY: Mother died of a heart attack in her 1050.  Father died of some lung problems, used to have a tracheostomy.  REVIEW OF SYSTEMS:  CONSTITUTIONAL:  Experiencing generalized weakness.   EYES: No change in vision. ENT: No change in hearing. RESPIRATORY: Has cough, shortness of breath. CARDIOVASCULAR: No chest pain, palpitations. GASTROINTESTINAL: Has nausea and vomiting. No diarrhea. GENITOURINARY: No dysuria or hematuria. HEMATOLOGIC:   No easy bruising or bleeding. SKIN:  No rash or lesions. MUSCULOSKELETAL: No joint pain or aches. NEUROLOGIC:  No weakness or numbness in the part of the body.  PHYSICAL EXAMINATION:  GENERAL: This is a well-built, well-developed, age appropriate male laying down in the bed not in distress  VITAL SIGNS: Temperature 102.1, pulse 100, blood pressure 126/62, respiratory rate of 22, oxygen saturation 90% on 2 liters of oxygen. HEENT:  Head normocephalic, atraumatic, No scleral icterus. Conjunctivae normal. Pupils equal and reactive to light. Mucous membranes moist.no lymphadenop[athy. No JVD. No carotid brui. .  CHEST: No focal tenderness.   LUNGS: Bilateral diffuse wheezing. Coarse breath sounds in the left lower lobe and some crackles. HEART:  S1, S2 regular.  No murmurs are heard. ABDOMEN: Bowel sounds present, soft, nontender, nondistended. No hepatosplenomegaly. EXTREMITIES:  no pedal edema pulses, 2+. SKIN: No rashes or lesions. MUSCULOSKELETAL: Good range of motion in all extremities. NEUROLOGIC: The patient is alert, oriented to place, person and time. Cranial nerves II through XII intact.  Motor 5/5 in upper and lower extremities.  LABORATORY DATA:   CMP: potassium 3.4, other labs within normal limits. CBC: WBC of 7.7, hemoglobin 12.2, platelet count of 274,000. Urine negative for nitrites and leukocyte esterase.  Chest x-ray: One view, portable. No active disease of the chest.  ASSESSMENT AND PLAN: Edward Durham, a 49 year old male, comes with pneumonia, chronic obstructive pulmonary disease exacerbation.  1. Pneumonia. Even the chest x-ray does not show any obvious infiltrates.  Has crackles in the left lower lobe.  Will treat as a community-acquired pneumonia with  Rocephin and Zithromax. Has brownish sputum.  Has a high-grade fever. 2. Chronic obstructive pulmonary disease exacerbation.   Continue Duo-nebs and Solu-Medrol. 3. Tobacco use. Consulted the patient. 4. Chronic pain. The  patient has been on methadone program.  Will continue with 1 dose of the methadone. 5. Keep the patient on DVT prophylaxis with Lovenox.  TIME SPENT:  50 minutes.   ____________________________ Susa Griffins, MD pv:dw D: 04/24/2014 03:00:00 ET T: 04/24/2014 07:04:56 ET JOB#: [id number]  cc: Onnie Boer. Carlynn Purl, MD Susa Griffins, MD, <Dictator>    Clerance Lav Alynn Ellithorpe MD ELECTRONICALLY SIGNED 05/06/2014 22:34

## 2014-10-01 NOTE — Consult Note (Signed)
Psychiatry: Follow-up for this patient with persistent delirium and difficulty weaning off of trips in the critical care unit.  When I saw him today he was calm but staff tell me that he continues to have intermittent episodes of agitation and striking staff at times seems to be more volitional than others.  He continues to be on a fentanyl drip currently at 200 g per hour but has been able to come off of his other medicines drips. levels of antipsychotic seem to help temporarily but are still having breakthrough times when he gets agitated.  Plan is to increase the Haldol intravenous doses from 2 mg to 4 mg every 6 hours standing without decreasing the Geodon or changing anything else.  Continue to follow-up hopefully the fentanyl we'll then gradually be able to beat tapered some more.  Electronic Signatures: Audery Amellapacs, Milca Sytsma T (MD)  (Signed on 11-Dec-15 19:27)  Authored  Last Updated: 11-Dec-15 19:27 by Audery Amellapacs, Solangel Mcmanaway T (MD)

## 2014-10-01 NOTE — Consult Note (Signed)
Chief Complaint:  Subjective/Chief Complaint seen for new dx hepatitis C, PEG. sedated on vent, critically ill. not responsive to questions or exam.   VITAL SIGNS/ANCILLARY NOTES: **Vital Signs.:   07-Dec-15 16:00  Vital Signs Type Routine  Pulse Pulse 104  Respirations Respirations 16  Systolic BP Systolic BP 161  Diastolic BP (mmHg) Diastolic BP (mmHg) 81  Mean BP 97  Pulse Ox % Pulse Ox % 97  Oxygen Delivery Ventilator Assisted   Brief Assessment:  Cardiac Regular   Respiratory clear BS   Gastrointestinal details normal Soft  No masses palpable  Bowel sounds normal  mild distension   Lab Results: Hepatic:  07-Dec-15 04:25   Bilirubin, Total 0.3  Alkaline Phosphatase 67 (46-116 NOTE: New Reference Range 12/28/13)  SGPT (ALT) 37 (14-63 NOTE: New Reference Range 12/28/13)  SGOT (AST)  45  Total Protein, Serum  6.1  Albumin, Serum  1.7  Routine Chem:  07-Dec-15 04:25   Result Comment LABS - This specimen was collected through an   - indwelling catheter or arterial line.  - A minimum of 46ms of blood was wasted prior    - to collecting the sample.  Interpret  - results with caution.  Result(s) reported on 16 May 2014 at 05:02AM.  Glucose, Serum 92  BUN  25  Creatinine (comp) 0.93  Sodium, Serum 139  Potassium, Serum 3.6  Chloride, Serum 99  CO2, Serum 32  Calcium (Total), Serum  7.9  Osmolality (calc) 282  eGFR (African American) >60  eGFR (Non-African American) >60 (eGFR values <673mmin/1.73 m2 may be an indication of chronic kidney disease (CKD). Calculated eGFR, using the MRDR Study equation, is useful in  patients with stable renal function. The eGFR calculation will not be reliable in acutely ill patients when serum creatinine is changing rapidly. It is not useful in patients on dialysis. The eGFR calculation may not be applicable to patients at the low and high extremes of body sizes, pregnant women, and vegetarians.)  Anion Gap 8  Triglycerides,  Serum  219 (Result(s) reported on 16 May 2014 at 05:08AM.)  Routine Hem:  07-Dec-15 04:25   WBC (CBC)  14.9  RBC (CBC)  3.00  Hemoglobin (CBC)  9.7  Hematocrit (CBC)  29.6  Platelet Count (CBC) 345  MCV 99  MCH 32.4  MCHC 32.8  RDW  14.7  Neutrophil % 71.9  Lymphocyte % 13.0  Monocyte % 6.1  Eosinophil % 8.1  Basophil % 0.9  Neutrophil #  10.7  Lymphocyte # 1.9  Monocyte # 0.9  Eosinophil #  1.2  Basophil # 0.1   Radiology Results: [place]   05-Dec-15 14:51, [place]  [place] General [place]   REASON FOR EXAM:    new diagnosis hepatitis c, abnormal liver tests.  COMMENTS:       PROCEDURE: [place]  - May 14 2014  2:51PM     CLINICAL DATA:  New diagnosis hepatitis-C.  Abnormal LFTs.    EXAM:  ULTRASOUND ABDOMEN COMPLETE    COMPARISON:  CT 04/26/2014    FINDINGS:  Gallbladder: No gallstones or wall thickening visualized. No  sonographic Murphy sign noted.  Common bile duct: Diameter: 7 mm    Liver: No focal lesion identified. Within normal limits in  parenchymal echogenicity.    IVC: No abnormality visualized.    Pancreas: Poorly visualized due to overlying bowel gas.    Spleen: Size and appearance within normal limits.  Right Kidney: Length: 11.0 cm. Echogenicity within normal limits. No  mass or hydronephrosis visualized.    Left Kidney: Length: 10.6 cm. Echogenicity within normal limits. No  mass or hydronephrosis visualized.    Abdominal aorta: No aneurysm visualized.    Other findings: None.     IMPRESSION:  Examination is somewhat limited secondary to patient being intubated  and unable to be moved.    The common bile duct is mildly dilated measuring 7 mm. In the  setting of abnormal liver function test, this can be correlated with  MRCP in the nonacute setting as clinically indicated.    Electronically Signed    By: Lovey Newcomer M.D.    On: 05/14/2014 16:56         Verified By: Ilsa Iha,  M.D.,   Assessment/Plan:  Assessment/Plan:  Assessment 1) critically ill on vent sedated, failure to extubate.  2) PNA 3) new diagnosis hepatitis C.  labs pending.  Ultrasound with mild dilation of cbd, lfts not obstructive.   Plan 1) PEG wed pm 2) awaiting further lab ( hcv rna x pcr)   Electronic Signatures: Loistine Simas (MD)  (Signed 07-Dec-15 17:29)  Authored: Chief Complaint, VITAL SIGNS/ANCILLARY NOTES, Brief Assessment, Lab Results, Radiology Results, Assessment/Plan   Last Updated: 07-Dec-15 17:29 by Loistine Simas (MD)

## 2014-10-01 NOTE — Consult Note (Signed)
PATIENT NAME:  Edward Durham, Edward Durham#:  [id number] DATE OF BIRTH:  December 11, 1965  DATE OF CONSULTATION:  05/11/2014  REFERRING PHYSICIAN:  Dr. Renae GlossWieting  CONSULTING PHYSICIAN:  Ollen GrossPaul S. Willeen CassBennett, MD  CONSULTING PHYSICIAN: Ollen GrossPaul S. Marion DownerScott Phyllis Whitefield, M.D.   REASON FOR CONSULTATION: Tracheostomy.   HISTORY OF PRESENT ILLNESS: A 49 year old male who was originally admitted to the hospital on November 15 signing out against medical advice with potential pneumonia. He was readmitted after the family had noted decreased responsiveness and was intubated in the Emergency Room. He has a history of COPD. They have not been able to wean him from the ventilator thus far. He was extubated twice during this admission. The first time was November 19 when the patient extubated himself. He ultimately required reintubation on the 20th. On the 25th he was extubated and had to be reintubated after further decompensation on the 26th. They have not been able to wean him from the ventilator since then. Yesterday, a bronchoscopy was performed revealing extensive purulent secretions and mucous plugs throughout the lungs. At this point, the likelihood of successful weaning and extubation is considered low and Dr. Belia HemanKasa has recommended proceeding with a tracheostomy. The patient apparently was found to have positive  blood cultures from his admission prior to the current admission, where he left the hospital against medical advice.   PAST MEDICAL HISTORY: Depression, osteoarthritis, chronic back pain, acute encephalopathy due to hypercarbia, MRSA pneumonia, as well as elevated troponin due to demand ischemia, and anemia of chronic disease   PAST SURGICAL HISTORY: Right thumb amputation, back surgery, neck surgery in 2013, left heel surgery.   ALLERGIES: MORPHINE.   SOCIAL HISTORY: A one-pack a day smoker with occasional alcohol use and has been on a methadone program for pain medication abuse.   FAMILY HISTORY: Mother died of a heart  attack in her 2450s. Father died of lung problems.   REVIEW OF SYSTEMS: Unable to obtain as the patient is intubated.   MEDICATIONS: Tylenol 650 mg per tube q. 4 hours p.r.n., citalopram 40 mg per tube daily, Lovenox 40 mg subcutaneous daily, sliding scale insulin, aspirin 81 mg daily, potassium phosphate 1 packet per tube daily, Haldol 2 mg IV q. 4 hours p.r.n. agitation, hydralazine 10 mg IV push q. 6 hours p.r.n. hypertension, nitroglycerin 2% ointment applied topically q. 6 hours as needed for angina, Pepcid 20 mg per tube b.i.d., gabapentin 100 mg per tube every 8 hours, quetiapine 50 mg orally per tube at bedtime, Colace 100 mg per tube q. 12 hours, ibuprofen 600 mg per tube every 6 hours p.r.n. fever, Senokot 10 mL per tube q. 12 hours, vecuronium 10 mg IV push every 6 hours p.r.n., Ativan 4 mg IV push q. 1 hour p.r.n., methadone 30 mg per tube q. 12 hours p.r.n., Zosyn 4.5 grams IV piggyback q. 8 hours.   PHYSICAL EXAMINATION: GENERAL: A thin male intubated and sedated and not responsive.  VITAL SIGNS: Pulse 106, respirations 15, blood pressure 108/60.  HEAD AND FACE: Head is normocephalic, atraumatic. No facial skin lesions.  EARS: External ears are unremarkable. Ear canals are clear. Tympanic membranes are clear bilaterally.  NOSE: External nose unremarkable. Nasal cavity is clear with no purulence. He has a nasogastric tube in place. Oropharynx: The patient is partially edentulous. The anterior lips, gums and anterior tongue are unremarkable, but examination is limited due to his intubated state and inability to open his mouth.  NECK: The neck is supple without adenopathy or mass. There  is no thyromegaly. He appears to have good landmarks.  NEUROLOGIC: Unobtainable due to intubated state.  RESPIRATORY: Lungs reveal rales bilaterally, but equal breath sounds.   ASSESSMENT: The patient with respiratory failure with failure to wean from the ventilator and continuing issues with pneumonia.    PLAN: He will be scheduled for a tracheostomy at the earliest possible date. I will see if one of my partners would be able to perform it this week, otherwise I will be able to perform the tracheostomy next Tuesday if I can get it scheduled.    ____________________________ Ollen Gross. Willeen Cass, MD psb:at D: 05/11/2014 10:58:31 ET T: 05/11/2014 14:51:05 ET JOB#: [id number]  cc: Ollen Gross. Willeen Cass, MD, <Dictator> Sandi Mealy MD ELECTRONICALLY SIGNED 05/29/2014 13:58

## 2014-10-01 NOTE — Consult Note (Signed)
Psychiatry: Follow-up for this gentleman with delirium and multiple medical problems.  Nursing tells me that he has been fairly calm today.  They have been able to decrease the dose of his fentanyl drip and discontinue the Versed drip altogether.  On interview the patient for me continues to have no response.  His eyes were open but he did not follow me with his eyes.  I spoke to him and asked him to close his eyes if he could hear me which she did not do.  Would not follow any commands or show any sign of hearing me.  Nursing tells me that at times he appears to be more awake and interactive but not in a coherent manner. suggest continuing the current doses of antipsychotics as ordered.  No sign of neuroleptic malignant syndrome or cogwheeling.  Continues to be pretty loose to exam.  Hopefully the rest of the drips can gradually be discontinued.  I will continue to follow up.  Electronic Signatures: Clapacs, Jackquline DenmarkJohn T (MD)  (Signed on 10-Dec-15 19:58)  Authored  Last Updated: 10-Dec-15 19:58 by Audery Amellapacs, John T (MD)

## 2014-10-01 NOTE — Consult Note (Signed)
Psychiatry: Follow-up 49 year old man with delirium and difficulty weaning off of medicine.  History of substance abuse.  Patient is now on fentanyl drip.  Nursing informs me that attempts earlier today to decrease doses resulted in another bout of agitation with threatening and violent behavior endangering himself and staff.  Patient currently unable to give any history. Progress has been made but he continues to require the fentanyl drip and attempts to further wean and have met with the same agitated behavior.  Medicine reviewed.  Can't increase the Geodon IM.  Getting fairly large frequent doses of Haldol IV as well.  I am going to add standing to milligram every 6 hours IV doses of Ativan which should be staggered in between the Haldol doses.  This may help to keep him under some medication more consistently.  Nursing continues to try to get him off of this sedating drips to allow for future transfer. remains delirium related to multiple medical factors as well as opiate abuse  Electronic Signatures: Clapacs, Madie Reno (MD)  (Signed on 12-Dec-15 12:29)  Authored  Last Updated: 12-Dec-15 12:29 by Gonzella Lex (MD)

## 2014-10-01 NOTE — Op Note (Signed)
PATIENT NAME:  Edward Durham, Edward Durham MR#:  [id number] DATE OF BIRTH:  [birth year]  DATE OF PROCEDURE:  05/16/2014  PREOPERATIVE DIAGNOSES: 1. Vent dependence. 2. Prolonged intubation.  POSTOPERATIVE DIAGNOSES:  1. Vent dependence. 2. Prolonged intubation.   OPERATIVE PROCEDURE: Tracheostomy.   ANESTHESIA: General.   COMPLICATIONS: None.   TOTAL ESTIMATED BLOOD LOSS: Minimal.  PROCEDURE:  The patient was given general anesthesia through the previous oral endotracheal tube and by IV. Once the patient was asleep, the neck was prepped and draped in sterile fashion. 3 mL of 1% Xylocaine were used for infiltration of the anterior neck. Incision line was marked and was incised with the 15 blade. Electrocautery was used to control bleeding. A small amount of subcutaneous fat was removed. The strap muscles were divided in the midline and the thyroid gland was noted crossing midline at the level of the cricoid cartilage. A trach hook was placed in the thyroid gland. It was lifted up with the cricoid cartilage. This left good visualization of the anterior trachea. The trachea was then incised between the second and third tracheal rings. An upside down U-shaped flap was created. The flap was sutured to the tissues inferiorly with a 4-0 Vicryl suture to help hold this open and protect the  airway.  A 8 Shiley T tube  was placed. The cuff had been checked and deflated,  lubricated. The trach tube was placed into the trachea. The hole in the trachea was below the bottom of the oral endotracheal tube. Once this was in place, the cuff was inflated anesthesia switched over to the trach tube. There was good aeration and CO2 return. He was aerating well. The trach tube was then secured to the skin using 2-0 nylon sutures on both sides, then a trach dressing and trach ties were placed. The patient tolerated the procedure well. He was awakened and taken to the recovery room in satisfactory condition. There were no operative  complications.   ____________________________ Cammy CopaPaul H. Candita Borenstein, MD phj:ap D: 05/16/2014 13:48:59 ET T: 05/16/2014 14:23:28 ET JOB#: [id number]  cc: Cammy CopaPaul H. Larance Ratledge, MD, <Dictator> Cammy CopaPAUL H Bran Aldridge MD ELECTRONICALLY SIGNED 05/16/2014 19:19

## 2014-10-01 NOTE — Discharge Summary (Signed)
PATIENT NAME:  Edward Durham, [place] V MR#:  [id number] DATE OF BIRTH:  01-Feb-1966  DATE OF ADMISSION:  04/24/2014 DATE OF DISCHARGE:  04/24/2014  The patient signed out AMA on 04/24/2014.  ADMITTING DIAGNOSES:  1. Chronic obstructive pulmonary disease exacerbation.  2. Pneumonia.   DISCHARGE DIAGNOSES:  1. Acute respiratory failure with hypoxia and hypercarbia.  2. Chronic obstructive pulmonary disease exacerbation. 3. Acute bronchitis.  4. Elevated transaminases of unclear etiology.  5. Ongoing tobacco abuse.  6. History of depression, osteoarthritis, as well as chronic back pain.   DISCHARGE CONDITION: Guarded.   DISCHARGE MEDICATIONS: The patient is not being discharged on any other medications as except for his home medications which are trazodone 50 mg at bedtime, omeprazole 10 mg daily, methadone 35 mg daily, Celexa 40 mg daily. No other medications were added due to the patient signing out against medical advice.   CONSULTANTS: Care management, social work.   RADIOLOGIC STUDIES: Chest x-ray portable single view on 04/24/2014 showed no active disease.   HISTORY OF PRESENT ILLNESS: The patient is a 49 year old, Caucasian male with a history of tobacco abuse who presented to the hospital with complaints of cough and shortness of breath. Please refer to Dr. Charlynne PanderKleinman's admission note on 04/24/2014. On arrival to the hospital, the patient was complaining of cough, which was had been ongoing for the past 3 or 4 days prior to coming to the hospital with also brownish sputum production and was found to have high fevers to 102 in the emergency room. He was also found to be hypoxic with O2 saturations of 80s. He was started on 2 liters of oxygen via nasal cannula. He was started on antibiotic therapy, as well as inhalers, nebulizers, steroids and admitted to the hospital for further evaluation.   PHYSICAL EXAMINATION: Revealed bilateral diffuse wheezing and coarse breath sounds in the lower lobe  and some crackles. Otherwise physical examination was unremarkable.   LABORATORY DATA: The patient's lab data done on arrival to the hospital showed elevated glucose level of 123, potassium of 3.4, bicarbonate level of 39, magnesium of 1.7, otherwise BMP was unremarkable. Calcium level was also found to be low at 7.2. Liver enzymes revealed albumin level of 3.3, AST 72, ALT was 90. Troponin was 0.02. White blood cell count was normal at 7.7, hemoglobin was 12.2, platelet count was 274,000 with a high MCV of 101. Last neutrophil count was 5.2. Blood cultures taken on 04/23/2014, showed no growth and chest xray was negative for pneumonia. Urinalysis was unremarkable.   HOSPITAL COURSE: The patient was admitted to the hospital for further evaluation. He was initiated on steroids and antibiotics, as well as inhalation therapy and nebulizers. He was also counseled about tobacco abuse and he was initiated on nicotine replacement therapy. With this, his condition somewhat improved but it was still somewhat tenuous. He continued to be on oxygen therapy when he suddenly decided to leave against medical advice. The risks were discussed with the patient by nursing staff, however, despite that discussion he decided to go against medical advice. On the day of discharge, 04/24/2014, his vital signs: Temperature was 98.4, pulse was 103, respiration rate was 20, blood pressure 134/76, saturation was 94% on 2 liters of oxygen through nasal cannula at rest.   TIME SPENT: 40 minutes.     ____________________________ Katharina Caperima Utah Delauder, MD rv:TT D: 04/24/2014 15:55:09 ET T: 04/24/2014 18:45:13 ET JOB#: [id number]  cc: Katharina Caperima Ronnita Paz, MD, <Dictator> Hailea Eaglin MD ELECTRONICALLY SIGNED 05/08/2014 20:29

## 2014-10-01 NOTE — Consult Note (Signed)
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   Patient was seen by consultant.   Consult note dictated.   Recommend to proceed with surgery or procedure.   Discussed with Attending MD.   Comments: rec C diff, agree with HIV testing. Sigmoidoscopy once awake/off vent doubt intraabd process.  Electronic Signatures: Lattie Hawooper, Alvaro Aungst E (MD)  (Signed [phone]-Nov-15 17:09)  Authored: Brief Consult Note   Last Updated: 20-Nov-15 17:09 by Lattie Hawooper, Kimyata Milich E (MD)

## 2014-10-01 NOTE — Consult Note (Signed)
PATIENT NAME:  Edward Durham, Edward Durham#:  [id number] DATE OF BIRTH:  08-28-1965  DATE OF CONSULTATION:  05/18/2014  REFERRING PHYSICIAN:   CONSULTING PHYSICIAN:  Audery AmelJohn T. Abel Ra, MD  IDENTIFYING INFORMATION AND REASON FOR CONSULTATION: A 49 year old man in the critical care unit on an extended stay. Consult for assistance with medication management.   HISTORY OF PRESENT ILLNESS:  I have seen this patient before, the last noted on him on November 30. At that time it had appeared that he was going to be on long-term ventilation and possibly was going to be transferred out soon. The patient is still in the hospital although he is on long-term ventilation still and there are new concerns about trying to taper him off of some of his sedating medicine. History today was obtained primarily from conversation with on the duty nurse and review of the chart. The patient now has n tracheostomy, but is still on full ventilation. His ammonia level has gone up and he is having to get lactulose. There has been concern about intermittent fevers that he has continued to spike. Various medication changes have been made. He has an NG tube or some kind of stomach tube now that allows them to give him some oral medicine and he has been getting Seroquel 100 mg twice a day and got citalopram as well. Concern was raised today it appears about whether he could have had serotonin syndrome. The patient is not able to give any direct history. I went to evaluate him and he opened his eyes and looked at me, but was unable to follow any commands. I asked him to close his eyes if he could hear me and he did not do it. Did not make any volitional movements. When I tried to move his arm he reacted as though it were painful. No other information obtained from the patient.   CURRENT MEDICATIONS: Currently he is getting a Versed drip which at this time is set at 5 mg per hour. He is on a fentanyl drip at 400 mcg per hour. He gets Haldol 2 mg  intravenously q. 4 hours as needed for agitation which was last given this morning about 9:30. He gets Ativan 4 mg q. 1 hour p.r.n. agitation which has been given 3 times today at 9:30, 10:20, and 12:45. He gets methadone through the stomach tube 30 mg q. 12 hours, he gets Seroquel 100 mg q. 12 hours the same way and also Neurontin 100 mg q. 8 hours. A fentanyl patch of 100 mcg was just started as well. The patient is on several antibiotics, one of which was Zyvox 600 mg q. 12 hours, that was discontinued as of this afternoon, last dose was given just after midnight last night.   VITAL SIGNS: Looking at the flow over the last day or so he had a temperature last night that went up to 101.3, but it came down rapidly over the next several hours. Prior to that his last temperature above 100 was on the morning of December 8. It looks like throughout the day yesterday he was running in the 99-100ish range. Today his temperature is down slightly compared to what it was yesterday. His blood pressures have been fairly stable over the last day or so. Pulse varies a bit, but not too much, I see a high late last night of 112, low recorded at 86 at 3:00 in the morning.   PHYSICAL EXAMINATION: Critically ill gentleman lying in the critical care unit.  Face has some perspiration as would be expected in the critical care unit, but he is not dripping, does not have large amounts of perspiration visible, there is no perspiration that I can find on the rest of his body. Lying still he does not have any obvious tremor in his upper extremities. When I uncovered his lower extremities he had a slight resting tremor. I tried to move some of his joints to test for clonus and he reacted with pain, but what I could tell did not seem to show any clonus. I did test the muscle tone of his right arm and did not find any resistance or stiffness at all to it.   ASSESSMENT: This is a critically ill gentleman on multiple medications, long-term  ventilation. Goal is to try and get him tapered off of some of his sedating medication safely. Nursing tells me that this morning they tried to cut his fentanyl drip just from 400 to 375 mcg per hour and within a half an hour he became violently agitated. There was also a concern raised about the possibility of serotonin syndrome.   As far as the serotonin syndrome it is not an easy thing to prove, but I would tend to say this is probably not serotonin syndrome. The fevers are not going that high, the tachycardia is not that high, and the blood pressure has been fairly stable. He does not have any sign of stiffness or clonus that I can observe. He is not perspiring heavily. Nonetheless I think it is fine to discontinue the citalopram and if he does not absolutely need the Zyvox exchanging it for other antibiotics is reasonable. Still I do not think we need to do anything specific and I doubt this is serotonin syndrome.   My suggestion for tapering him off his medicines would essentially be to try to premedicate him with anti-delirium medications before making any changes. I am going to put him on Geodon 20 mg IM q. 12 hours as a standing thing. We can also give him the Haldol as a standing dose several times a day and I would suggest that all future attempts to taper his medicines come right after he gets a dose of Haldol. If that still is not working I would try giving him the Haldol and the Ativan together before decreasing the dose. I recommend coming down on the fentanyl first, although if that can start to come down reasonably then the Versed could be gradually decreased as well. I will follow. Plan discussed with nurses on duty.   DIAGNOSIS PRINCIPAL AND PRIMARY:   AXIS I: Delirium related to multiple factors in the critical care unit including current medication.  SECONDARY DIAGNOSES:   AXIS I:  Opiate abuse, severe.   ____________________________ Audery Amel, MD jtc:bu D: 05/18/2014  15:57:33 ET T: 05/18/2014 16:11:49 ET JOB#: [id number]  cc: Audery Amel, MD, <Dictator> Audery Amel MD ELECTRONICALLY SIGNED 05/22/2014 11:20

## 2014-10-01 NOTE — Consult Note (Signed)
Psychiatry: Follow-up for this patient still ventilated still unable to come off of his fentanyl drip without agitation.  On my examination today he is asleep but nursing tells me that he was agitated shortly before I arrived and is still intermittently trying to climb out of the bed and striking out.  Patient not able to provide any review of systems.  Unclear what his underlying level of consciousness is when awake. increased his medicine by adding standing Ativan yesterday.  Now getting standing Ativan and Haldol and his Geodon.  Still gets agitated coming off the fentanyl.  At this point I don't think I would add any further additional medicine.  We'll see how he does over the next day.  He is on quite a bit of sedating medicine and I'm a little hesitant to go to higher doses yet as a standing thing. change to diagnosis.  Electronic Signatures: Finbar Nippert, Jackquline DenmarkJohn T (MD)  (Signed on 13-Dec-15 13:25)  Authored  Last Updated: 13-Dec-15 13:25 by Audery Amellapacs, Jatavia Keltner T (MD)

## 2014-10-01 NOTE — Consult Note (Signed)
Chief Complaint:  Subjective/Chief Complaint seen for new diagnosis of hepatitis c and PEG placement.  much more calm today.  will squeeze hand on command, does not track with eyes or respond to questions.   VITAL SIGNS/ANCILLARY NOTES: **Vital Signs.:   10-Dec-15 08:00  Vital Signs Type Routine  Temperature Temperature (F) 97.4  Celsius 36.3  Temperature Source axillary  Pulse Pulse 103  Pulse source if not from Vital Sign Device per cardiac monitor  Respirations Respirations 17  Systolic BP Systolic BP 99  Diastolic BP (mmHg) Diastolic BP (mmHg) 64  Mean BP 75  BP Source  if not from Vital Sign Device non-invasive  Pulse Ox % Pulse Ox % 95  Pulse Ox Activity Level  At rest  Oxygen Delivery Ventilator Assisted  CO2 Monitor CO2 Monitor 45    10:00  Vital Signs Type Routine  Pulse Pulse 96  Pulse source if not from Vital Sign Device per cardiac monitor  Respirations Respirations 15  Systolic BP Systolic BP 119  Diastolic BP (mmHg) Diastolic BP (mmHg) 81  Mean BP 93  BP Source  if not from Vital Sign Device non-invasive  Pulse Ox % Pulse Ox % 96  Pulse Ox Activity Level  At rest  Oxygen Delivery Ventilator Assisted  CO2 Monitor CO2 Monitor 45   Brief Assessment:  Cardiac Regular   Respiratory occasional rhonchi   Gastrointestinal details normal mild distension, bs positive, no apparent pain to palpation.   Lab Results:  Routine Coag:  09-Dec-15 05:20   Activated PTT (APTT)  40.6 (A HCT value >55% may artifactually increase the APTT. In one study, the increase was an average of 19%. Reference: "Effect on Routine and Special Coagulation Testing Values of Citrate Anticoagulant Adjustment in Patients with High HCT Values." American Journal of Clinical Pathology 2006;126:400-405.)  Prothrombin 14.1  INR 1.1 (INR reference interval applies to patients on anticoagulant therapy. A single INR therapeutic range for coumarins is not optimal for all indications; however, the  suggested range for most indications is 2.0 - 3.0. Exceptions to the INR Reference Range may include: Prosthetic heart valves, acute myocardial infarction, prevention of myocardial infarction, and combinations of aspirin and anticoagulant. The need for a higher or lower target INR must be assessed individually. Reference: The Pharmacology and Management of the Vitamin K  antagonists: the seventh ACCP Conference on Antithrombotic and Thrombolytic Therapy. Chest.2004 Sept:126 (3suppl): [id number]. A HCT value >55% may artifactually increase the PT.  In one study,  the increase was an average of 25%. Reference:  "Effect on Routine and Special Coagulation Testing Values of Citrate Anticoagulant Adjustment in Patients with High HCT Values." American Journal of Clinical Pathology 2006;126:400-405.)  Routine Hem:  09-Dec-15 05:20   WBC (CBC) 9.4  RBC (CBC)  2.96  Hemoglobin (CBC)  9.6  Hematocrit (CBC)  29.3  Platelet Count (CBC) 355  MCV 99  MCH 32.6  MCHC 32.9  RDW  14.9  Neutrophil % 60.5  Lymphocyte % 20.4  Monocyte % 8.8  Eosinophil % 8.8  Basophil % 1.5  Neutrophil # 5.7  Lymphocyte # 1.9  Monocyte # 0.8  Eosinophil #  0.8  Basophil # 0.1 (Result(s) reported on 18 May 2014 at 05:42AM.)   Assessment/Plan:  Assessment/Plan:  Assessment 1) sepsis/MRSA PNA with respiratory failure, vent dependant/failure to wean.   2) failure to feed.  3) history of substance abuse, copd, anemia of chronic disease, depression, osteoarthritis, chronic back pain. 4) new diagnosis of chronic hepatitis c in the  setting of recent ivda/remote substance abuse. Positive hcv rna  pcr, genotype pending.   Plan 1) EGD/PEG today.  I have discussed the risks benefits and complicatiosn of proceedures to include not limited to bleeding infection perforation and sedation with patients wife and she wishes to proceed.   Electronic Signatures: Barnetta Chapel (MD)  (Signed 10-Dec-15 12:00)  Authored: Chief  Complaint, VITAL SIGNS/ANCILLARY NOTES, Brief Assessment, Lab Results, Assessment/Plan   Last Updated: 10-Dec-15 12:00 by Barnetta Chapel (MD)

## 2014-10-01 NOTE — Consult Note (Signed)
Psychiatry: Follow-up for this patient with prolonged delirium.  On evaluation this evening he was asleep.  Did not attempt to arouse him.  Seems to be still having some intermittent episodes of agitation. note that he is still on Precedex drip but the dose is significantly lower than what it was last time I checked on Friday.  I also note that he continues to get quite a bit of sedating medicine on a standing order throughout the day including Valium, Geodon, Seroquel, Risperdal, methadone. am not going to presumed to decrease or discontinue anything further today while the goal is still to try to gradually wean off the Precedex.  Ultimately it would be good to not be on 3 antipsychotics if not necessary and ultimately to try to cut down the dosage to where he can be awake, but until he can do that without dangerous agitation it's better to leave things as they are.  I will follow up as needed.  Electronic Signatures: Jeanine Caven, Jackquline DenmarkJohn T (MD)  (Signed on 21-Dec-15 20:43)  Authored  Last Updated: 21-Dec-15 20:43 by Audery Amellapacs, Marquez Ceesay T (MD)

## 2014-10-01 NOTE — Consult Note (Signed)
Chief Complaint:  Subjective/Chief Complaint Please see full GI consult [phone].  Recommendations for further testing re new diagnosis of hepatitis C ordered, plans for PEG this coming week.  Dr Shelle Ironein available this weekend if needed.   Electronic Signatures: Barnetta ChapelSkulskie, Martin (MD)  (Signed 04-Dec-15 17:35)  Authored: Chief Complaint   Last Updated: 04-Dec-15 17:35 by Barnetta ChapelSkulskie, Martin (MD)

## 2014-10-01 NOTE — Consult Note (Signed)
   Present Illness Patient is a 49 year old male who was admitted after being noted to be hypercapnic and unresponsive requiring intubation.  He had presented earlier to the hospital where he was noted to be hypoxic and hypercapnic.  He was given bronchodilators and steroids but left against medical advice.  He currently is intubated.  Cardiac consultation was requested regarding his elevated serum troponin.  Patient has a normal electrocardiogram.  He apparently was complaining of shortness of breath but denied chest pain on earlier   Reason patient.  He is not able to give a history at present.   Physical Exam:  GEN critically ill appearing   NECK supple   RESP rhonchi   CARD Regular rate and rhythm  Normal, S1, S2   ABD denies tenderness  no liver/spleen enlargement  normal BS   LYMPH negative neck   EXTR negative cyanosis/clubbing   SKIN normal to palpation   NEURO cranial nerves intact   PSYCH sedated   Review of Systems:  Subjective/Chief Complaint intubated and sedated   ROS Pt not able to provide ROS   Medications/Allergies Reviewed Medications/Allergies reviewed   Family & Social History:  Family and Social History:  Social History positive  tobacco    Morphine: Itching   Impression patient with respiratory failure.  He is currently intubated.  He has a mild serum troponin elevation.  Given history and symptoms prior to intubation, this appears to be demand ischemia.  Patient currently is not appear to have an acute coronary event.  Will continue with current in [place] antibiotic and bronchodilator therapy.  Will closely follow.  Echocardiogram reveals preserved left ventricular function.  Which attempt to wean.  Further noninvasive versus invasive cardiac workup will depend on course.   Plan 1. Continue to wean vent as tolerated 2. Bronchodilators 3. does not appear to have had an acute coronary event.  Would not proceed with cardiac catheterization at present.   Will closely follow 4. further recommendations pending for   Electronic Signatures: Dalia HeadingFath, Leilene Diprima A (MD)  (Signed [phone]-Nov-15 21:05)  Authored: General Aspect/Present Illness, History and Physical Exam, Review of System, Family & Social History, Allergies, Impression/Plan   Last Updated: 17-Nov-15 21:05 by Dalia HeadingFath, Danyele Smejkal A (MD)

## 2014-10-01 NOTE — Consult Note (Signed)
Psychiatry: Follow-up for patient with persistent delirium and difficulty coming off of his drips in the intensive care unit.  When I came to see him today he was asleep.  Nursing reports the same pattern as before that he will get agitated intermittently and try to climb out of the bed especially when suctioning is done.  After medication dosages he usually becomes more sleepy and returns to being calm. plans to try to titrate down his fentanyl and Precedex.  No change to current antipsychotic levels.  Electronic Signatures: Britanni Yarde, Jackquline DenmarkJohn T (MD)  (Signed on 15-Dec-15 18:00)  Authored  Last Updated: 15-Dec-15 18:00 by Audery Amellapacs, Lashunda Greis T (MD)

## 2014-10-01 NOTE — Consult Note (Signed)
Chief Complaint:  Subjective/Chief Complaint seen for new diagnosis hepatitis c, PEG.  patient awake, irritable. will not follow directions.  will not answer questions/ams.   VITAL SIGNS/ANCILLARY NOTES: **Vital Signs.:   08-Dec-15 11:15  Vital Signs Type Routine  Temperature Source oral  Pulse Pulse 108  Respirations Respirations 14  Systolic BP Systolic BP 517  Diastolic BP (mmHg) Diastolic BP (mmHg) 74  Mean BP 90  Pulse Ox % Pulse Ox % 99  Oxygen Delivery Ventilator Assisted   Brief Assessment:  Cardiac --Rub  --Gallop  tachycardia   Respiratory coarse lung souonds bilateral   Gastrointestinal details normal Soft  Bowel sounds normal  mild distension, no apparent pain on palpation,   Lab Results: LabObservation:  08-Dec-15 10:41   OBSERVATION Reason for Test Swelling, previous PICC  Hepatic:  07-Dec-15 04:25   Bilirubin, Total 0.3  Alkaline Phosphatase 67 (46-116 NOTE: New Reference Range 12/28/13)  SGPT (ALT) 37 (14-63 NOTE: New Reference Range 12/28/13)  SGOT (AST)  45  Total Protein, Serum  6.1  Albumin, Serum  1.7  Routine Micro:  06-Dec-15 22:44   Micro Text Report BLOOD CULTURE   COMMENT                   NO GROWTH IN 18-24 HOURS   ANTIBIOTIC                       Culture Comment NO GROWTH IN 18-24 HOURS  Result(s) reported on 16 May 2014 at 11:00PM.  General Ref:  04-Dec-15 12:11   HCV, Qt, RT-PCR, Reflex to Genotype ========== TEST NAME ==========  ========= RESULTS =========  = REFERENCE RANGE =  HCV,QT RT-PCR,RFX-GENOTY  HCV RNA by PCR, Qn Rfx Geno Hepatitis C Quantitation        [   [id number] IU/mL        ]                   HCV log10 [   6.201 log10 IU/mL    ]                   Test Information:               [   Final Report         ]                   The quantitative range of the assay is 15 IU/mL to 100 million IU/mL using COBAS(R) TaqMan(R) HCV test, v 2.0. The limit of detection (LOD) and lower limit of quantification (LLOQ) for this  assay is 15 IU/mL. Results less than the quantitative range of the assay will be reported as "HCV RNA detected, less than 15 IU/mL". HCV Genotype                    [   Final Report        ]                   To be performed on this specimen. Hepatitis C Genotype            [   Result Pending       ]                   Please note:                    [  Final Report         ]                   This test was developed and its performance characteristics determined by [facility].  It has not been cleared or approved by the U.S. Food and Drug Administration.                                                                      . The FDA has determined that such clearance or approval is not necessary. This test is used for clinical purposes.  It should not be regarded as investigational or for research.               [facility]            No: [id number]           [address] [place], [place] [address]           Lindon Romp, MD         [phone]   Result(s) reported on 17 May 2014 at 02:23PM.  Routine Chem:  07-Dec-15 04:25   Result Comment LABS - This specimen was collected through an   - indwelling catheter or arterial line.  - A minimum of 84ms of blood was wasted prior    - to collecting the sample.  Interpret  - results with caution.  Result(s) reported on 16 May 2014 at 05:02AM.  Glucose, Serum 92  BUN  25  Creatinine (comp) 0.93  Sodium, Serum 139  Potassium, Serum 3.6  Chloride, Serum 99  CO2, Serum 32  Calcium (Total), Serum  7.9  Osmolality (calc) 282  eGFR (African American) >60  eGFR (Non-African American) >60 (eGFR values <612mmin/1.73 m2 may be an indication of chronic kidney disease (CKD). Calculated eGFR, using the MRDR Study equation, is useful in  patients with stable renal function. The eGFR calculation will not be reliable in acutely ill patients when serum creatinine is changing rapidly. It is not useful in patients on dialysis. The  eGFR calculation may not be applicable to patients at the low and high extremes of body sizes, pregnant women, and vegetarians.)  Anion Gap 8  Triglycerides, Serum  219 (Result(s) reported on 16 May 2014 at 05:08AM.)  08-Dec-15 12:01   Ammonia, Plasma  57 (Result(s) reported on 17 May 2014 at 12:47PM.)  Routine Hem:  07-Dec-15 04:25   WBC (CBC)  14.9  RBC (CBC)  3.00  Hemoglobin (CBC)  9.7  Hematocrit (CBC)  29.6  Platelet Count (CBC) 345  MCV 99  MCH 32.4  MCHC 32.8  RDW  14.7  Neutrophil % 71.9  Lymphocyte % 13.0  Monocyte % 6.1  Eosinophil % 8.1  Basophil % 0.9  Neutrophil #  10.7  Lymphocyte # 1.9  Monocyte # 0.9  Eosinophil #  1.2  Basophil # 0.1   Radiology Results: [place]   05-Dec-15 14:51, [place]  [place] General [place]   REASON FOR EXAM:    new diagnosis hepatitis c, abnormal liver tests.  COMMENTS:       PROCEDURE: [place]  - May 14 2014  2:51PM  CLINICAL DATA:  New diagnosis hepatitis-C.  Abnormal LFTs.    EXAM:  ULTRASOUND ABDOMEN COMPLETE    COMPARISON:  CT 04/26/2014    FINDINGS:  Gallbladder: No gallstones or wall thickening visualized. No  sonographic Murphy sign noted.  Common bile duct: Diameter: 7 mm    Liver: No focal lesion identified. Within normal limits in  parenchymal echogenicity.    IVC: No abnormality visualized.    Pancreas: Poorly visualized due to overlying bowel gas.    Spleen: Size and appearance within normal limits.    Right Kidney: Length: 11.0 cm. Echogenicity within normal limits. No  mass or hydronephrosis visualized.    Left Kidney: Length: 10.6 cm. Echogenicity within normal limits. No  mass or hydronephrosis visualized.    Abdominal aorta: No aneurysm visualized.    Other findings: None.     IMPRESSION:  Examination is somewhat limited secondary to patient being intubated  and unable to be moved.    The common bile duct is mildly dilated measuring 7 mm. In  the  setting of abnormal liver function test, this can be correlated with  MRCP in the nonacute setting as clinically indicated.    Electronically Signed    By: Lovey Newcomer M.D.    On: 05/14/2014 16:56         Verified By: Ilsa Iha, M.D.,   Assessment/Plan:  Assessment/Plan:  Assessment 1) new diagnosis hepatitis C.  Positive RNA/PCR, genotype pending.   2) AMS-histoyr of possible withdrawl of narcotics in setting of history of drug abuse.  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   Plan 1) plans for peg tomorrow pm.   2) no treatment currently for hepatitis C,  will have further consideration asn clinically possible in the future. 3) in regard to health care worker exposure, There is no prophylaxis recommended for acute exposure to hepatitis C.  Recommendation is to repeat a hepatitis c in 4-6 months.  Avoid donating blood, plasma tissue or semen during the follow up period.  (CDC recommendation)   Electronic Signatures: Loistine Simas (MD)  (Signed 08-Dec-15 18:05)  Authored: Chief Complaint, VITAL SIGNS/ANCILLARY NOTES, Brief Assessment, Lab Results, Radiology Results, Assessment/Plan   Last Updated: 08-Dec-15 18:05 by Loistine Simas (MD)

## 2014-10-01 NOTE — Consult Note (Signed)
PATIENT NAME:  Edward Durham, [name] MR#:  [id number] DATE OF BIRTH:  02-09-1966  DATE OF CONSULTATION:  05/09/2014  REFERRING PHYSICIAN:   CONSULTING PHYSICIAN:  Audery AmelJohn T. Harjot Dibello, MD  IDENTIFYING INFORMATION AND REASON FOR CONSULTATION: A 49 year old man currently in the critical care unit intubated. Consultation for management of delirium.   HISTORY OF PRESENT ILLNESS: Information obtained largely from the chart and from conversation with the current nursing staff. The patient is still intubated and sedated and unable to give any history. The patient came into the hospital originally on November 15th with a pneumonia, but walked out AMA. He came back November 16th unresponsive, septic, has been in the critical care unit ever since. He self extubated on the 19th and was reintubated. An extubation was attempted on November 25th and the patient became extremely agitated to the point that he was not safe and had to be sedated and reintubated. There is a plan now for extubation again tomorrow. Judging from the fact that the patient is on methadone I am assuming that he may have an identified history of opiate abuse. I do not have records of prior psychiatric treatment but I looked at his prescription medicines in the controlled substance database. Up until at least the middle to late part of October he was continuing to get multiple doses of opiates from multiple different providers. Unclear if he was still taking opiates other than the methadone when he came into the hospital. I note from the chart that on the 25th, when he was extubated previously, that he had been on a propofol and fentanyl drip and that both of those were discontinued from what I can tell prior to extubation. He is currently getting propofol, Precedex and fentanyl drips as well as Ativan.   PAST PSYCHIATRIC HISTORY: The chart does not indicate any past psychiatric treatment here at the hospital. I only see that he had been evaluated in a pain  clinic in the past. I tried to call his wife at the available phone number today to get past history and was not able to get any information. The patient is not able to offer any history right now.   FAMILY HISTORY: Unknown.   SOCIAL HISTORY: Unknown to me. There is a phone number for the wife in the chart. It is not clear to me whether he is living with her or not or what his social situation is.   SUBSTANCE ABUSE HISTORY: Presumed problems with opiate use, but the details are unknown.   PAST MEDICAL HISTORY: Currently having a pneumonia with sepsis from which he is gradually recovering in the critical care unit.  CURRENT MEDICATIONS: Citalopram 40 mg daily, docusate 100 mg q. 12, Pepcid 20 mg twice a day, gabapentin 100 mg q. 8 hours, methadone 15 mg by tube every 12 hours, potassium q. 12 hours, quetiapine 50 mg at bedtime, senna 10 mL q. 12 hours, vancomycin 1000 mg q. 8 hours, aspirin 81 mg a day, Lovenox 40 mg subcutaneous every day, insulin sliding scale, fentanyl drip, Ativan drip, propofol drip, p.r.n. Haldol and ibuprofen.   ALLERGIES: MORPHINE.   REVIEW OF SYSTEMS: The patient is not able to give any review of systems.   MENTAL STATUS EXAMINATION: The patient is intubated, sedated. I did not make any attempt to try and wake him up as there would be no useful history that could be given and I did not want to unnecessarily agitate him.   ASSESSMENT: This is a 49 year old man about  whom I have reason to suspect opiate abuse or at least to know that he was on significant amounts of opiates before coming into the hospital. The consult mentions something about heroin withdrawal. I could not find any reference in the chart to any history of heroin use so I do not know if that is correct or not. In any case, at this point, he should be past the worst stages of heroin withdrawal. In any case, he is currently getting high doses of narcotics including the fentanyl and the morphine that would  suppress any withdrawal symptoms.   I understand the patient became agitated when he was extubated last time and there is concern about how to manage that again this time.   TREATMENT PLAN: Management of airways and extubation is outside my realm of expertise. I am hesitant to make specific recommendations without knowing the usual procedure for doing this. I would point out that last time he was extubated both the propofol and the fentanyl were stopped abruptly. I might suggest that this time that not all of his drips be discontinued completely. The propofol could be discontinued and the Ativan and fentanyl turned down gently, but not stopped completely at the time of extubation. This could allow then for a gradual decrease in those medicines rather than abruptly stopping them. I also would suggest giving him 2 mg of Haldol IV perhaps just before the extubation to try and prevent some degree of the delirium.   DIAGNOSIS, PRINCIPAL AND PRIMARY:  AXIS I: Delirium due to multiple factors in the critical care unit.  SECONDARY DIAGNOSES: AXIS I: Opiate abuse, presumed.  AXIS II: No diagnosis.  AXIS III: Pneumonia, intubated, critical illness.  ____________________________ Audery Amel, MD jtc:sb D: 05/09/2014 15:53:54 ET T: 05/09/2014 16:05:56 ET JOB#: [id number]  cc: Audery Amel, MD, <Dictator> Audery Amel MD ELECTRONICALLY SIGNED 05/22/2014 11:19

## 2014-10-01 NOTE — Consult Note (Signed)
Psychiatry: Follow-up for this patient with persistent delirium difficulty weaning off of medication.  Fentanyl drip has been decreased significantly.  Patient still has some episodes of agitation and trying to climb out of bed.  When I was there today he was asleep and not agitated. have explicitly change the times of the orders so that the Ativan and Haldol will be alternated by 3 hours.  Continue current medications.  Continue trying to gradually wean him down on the fentanyl.  No other change to medicine for today we will continue to follow.  Electronic Signatures: Clapacs, Jackquline DenmarkJohn T (MD)  (Signed on 14-Dec-15 15:07)  Authored  Last Updated: 14-Dec-15 15:07 by Audery Amellapacs, John T (MD)

## 2014-10-01 NOTE — H&P (Signed)
PATIENT NAME:  Edward Durham, Edward Durham DATE OF BIRTH:  1965-09-07  DATE OF ADMISSION:  04/25/2014  PRIMARY CARE PHYSICIAN: Dr. Carlynn PurlSowles.    CHIEF COMPLAINT:  Brought in with decreased responsiveness.   HISTORY OF PRESENT ILLNESS: This is a 49 year old man who was admitted to the hospital on 04/24/2014 and signed out against medical advice after being admitted with potential pneumonia. He was started on Rocephin and Zithromax. The patient had a fever, was started on Solu-Medrol for COPD exacerbation. The patient was signed out AMA with acute respiratory failure with hypoxia and hypercarbia, COPD exacerbation, bronchitis. The patient was brought in with decreased responsiveness by family, unfortunately no family present at the time of admission. ER physician had to intubate the patient in the Emergency Room secondary to decreased responsiveness and respiratory issues. The patient audibly wheezing when I saw him. The patient's blood pressure was on the lower side at 90/74, pulse of 114, he was on 100% FiO2 when I saw him. Looking through laboratory data from yesterday he did have positive blood cultures of a gram-negative rod and hospitalist services were contacted for further evaluation.   PAST MEDICAL HISTORY: Depression, osteoarthritis, chronic back pain.   PAST SURGICAL HISTORY: Right thumb amputation, back surgery, neck surgery in 2013, left heel surgery.   ALLERGIES: MORPHINE.   MEDICATIONS: As per prescription writer include Celexa 40 mg daily, methadone 35 mg daily, omeprazole 20 mg daily, trazodone 50 mg daily.   SOCIAL HISTORY: As per yesterday's H and P smokes 1 pack per day, occasional alcohol, using pain medications, but currently on a methadone program.   FAMILY HISTORY: Mother died of a heart attack in her 3950s. Father died of lung problems and had a tracheostomy.   REVIEW OF SYSTEMS: Unable to obtain secondary to patient being sedated on the ventilator.  PHYSICAL  EXAMINATION:  VITAL SIGNS: On presentation to the hospital include a temperature of 98.3, pulse 120, respirations 6, blood pressure 125/92, pulse oximetry 66% on room air.  GENERAL:  The patient currently breathing comfortably on the ventilator.  EYES: Pupil reactive to light, conjunctivae normal, lids normal.  EARS, NOSE, MOUTH, AND THROAT: Tympanic membranes, no erythema. Nasal mucosa, no erythema. Unable to examine mouth.  NECK: No JVD. No bruits. No lymphadenopathy. No thyromegaly. No thyroid nodules palpated.  RESPIRATORY: Poor air entry bilaterally, diffuse wheezing throughout entire lung field.  CARDIOVASCULAR: S1, S2 normal. No gallops, rubs, or murmurs heard. Carotid upstroke 2 + bilaterally. No bruises.  Dorsalis pulses 2 + bilaterally. No edema of the lower extremity.  ABDOMEN: Soft, nontender. No organomegaly/splenomegaly. Normoactive bowel sounds. No masses felt.  LYMPHATIC: No lymph nodes in the neck.  MUSCULOSKELETAL: No cyanosis on oxygen ventilator. No clubbing. No edema.  SKIN: No ulcers or lesions anteriorly.  NEUROLOGIC: The patient is sedated on the ventilator, unable to assess at this time.  PSYCHIATRIC: The patient is sedated on ventilator, unable to assess at this time.   LABORATORY AND RADIOLOGICAL DATA: INR 1.1. Phosphorus 3.3. Troponin borderline at 0.10, magnesium 1.9. Glucose 147, BUN 16, creatinine 1.09, sodium 139, potassium 5.9, chloride, CO2, calcium 7.1. Liver function tests, alkaline phosphatase 94, ALT 72, AST 62, albumin 3.2. White blood cell count 5.9, H and H 11.7 and 35.6, platelet count of 282,000, MCV 106. Lactic acid 2.7. ABG showed a pH of 7.35, pCO2 of 62, pO2 of 387, that was on 100%, O2 saturation 97%. Chest x-ray post intubation, endotracheal tube in satisfactory position, no acute findings  in the chest. EKG showed sinus tachycardia at 125 beats per minute.   ASSESSMENT AND PLAN:  1.  Acute respiratory failure with decreased respirations on  presentation, diffuse expiratory and inspiratory wheeze, and poor air entry related to chronic obstructive pulmonary disease exacerbation. We will give antibiotics, IV steroids, Combivent. Continue to monitor on ventilator at this point. I will send off a urine toxicology because this could be overdose causing respiratory depression on presentation.  2.  Clinical sepsis, unclear source at this point. Will send off a urinalysis and urine culture. Chest x-ray does not show a pneumonia at this point, but maybe with hydration it will. Gram-negative rods in blood culture from yesterday, will repeat, give empiric Levaquin and Zosyn. In the ER the patient was given a dose of vancomycin also.  3.  Chronic pain. I will send off a urine toxicology, give a lower dose of methadone to prevent withdrawal.  4.  Depression. Hold medication at this time.  5.  Gastroesophageal reflux disease, on PPI as outpatient. We will use famotidine here.  6.  Elevated troponin. We will get serial troponins, likely this is demand ischemia from respiratory failure.  7.  Hyperkalemia. Will give a dose of Kayexalate and give IV fluid hydration and recheck later. 8.  Elevated liver function tests. Will continue to monitor during the hospital course.     TIME SPENT ON ADMISSION:  50 minutes critical care time.    ____________________________ Herschell Dimes. Renae Gloss, MD rjw:bu D: 04/25/2014 15:00:53 ET T: 04/25/2014 15:39:07 ET JOB#: [id number]  cc: Herschell Dimes. Renae Gloss, MD, <Dictator> Onnie Boer. Carlynn Purl, MD Salley Scarlet MD ELECTRONICALLY SIGNED 04/26/2014 12:58

## 2014-10-01 NOTE — Consult Note (Signed)
Chief Complaint:  Subjective/Chief Complaint seems more calm today with continued ams.  Easily agitated.   VITAL SIGNS/ANCILLARY NOTES: **Vital Signs.:   09-Dec-15 12:00  Temperature Temperature (F) 98.5  Celsius 36.9  Temperature Source oral  Pulse Pulse 102  Pulse source if not from Vital Sign Device per cardiac monitor  Respirations Respirations 17  Systolic BP Systolic BP 115  Diastolic BP (mmHg) Diastolic BP (mmHg) 75  Mean BP 88  BP Source  if not from Vital Sign Device non-invasive  Pulse Ox % Pulse Ox % 94  Pulse Ox Activity Level  At rest  Oxygen Delivery Ventilator Assisted  CO2 Monitor CO2 Monitor 48  *Intake and Output.:   Shift 09-Dec-15 15:00  Enteral feeding ml     In:  0  Enteral Feeding flush intake      In:  0  IV (Primary)      In:  45  IV (Primary)      In:  197.5  Length of Stay Totals Intake:  82850 Output:  [id number]    Net:  [id number]   Brief Assessment:  Cardiac Regular  tachycardia   Respiratory clear BS   Gastrointestinal details normal mild to moderate distension   Lab Results: Routine Chem:  08-Dec-15 12:01   Ammonia, Plasma  57 (Result(s) reported on 17 May 2014 at 12:47PM.)  Routine Coag:  09-Dec-15 05:20   Activated PTT (APTT)  40.6 (A HCT value >55% may artifactually increase the APTT. In one study, the increase was an average of 19%. Reference: "Effect on Routine and Special Coagulation Testing Values of Citrate Anticoagulant Adjustment in Patients with High HCT Values." American Journal of Clinical Pathology 2006;126:400-405.)  Prothrombin 14.1  INR 1.1 (INR reference interval applies to patients on anticoagulant therapy. A single INR therapeutic range for coumarins is not optimal for all indications; however, the suggested range for most indications is 2.0 - 3.0. Exceptions to the INR Reference Range may include: Prosthetic heart valves, acute myocardial infarction, prevention of myocardial infarction, and combinations of aspirin  and anticoagulant. The need for a higher or lower target INR must be assessed individually. Reference: The Pharmacology and Management of the Vitamin K  antagonists: the seventh ACCP Conference on Antithrombotic and Thrombolytic Therapy. Chest.2004 Sept:126 (3suppl): [id number]. A HCT value >55% may artifactually increase the PT.  In one study,  the increase was an average of 25%. Reference:  "Effect on Routine and Special Coagulation Testing Values of Citrate Anticoagulant Adjustment in Patients with High HCT Values." American Journal of Clinical Pathology 2006;126:400-405.)  Routine Hem:  09-Dec-15 05:20   WBC (CBC) 9.4  RBC (CBC)  2.96  Hemoglobin (CBC)  9.6  Hematocrit (CBC)  29.3  Platelet Count (CBC) 355  MCV 99  MCH 32.6  MCHC 32.9  RDW  14.9  Neutrophil % 60.5  Lymphocyte % 20.4  Monocyte % 8.8  Eosinophil % 8.8  Basophil % 1.5  Neutrophil # 5.7  Lymphocyte # 1.9  Monocyte # 0.8  Eosinophil #  0.8  Basophil # 0.1 (Result(s) reported on 18 May 2014 at 05:42AM.)   Radiology Results: XRay:    09-Dec-15 07:01, KUB - Kidney Ureter Bladder  KUB - Kidney Ureter Bladder   REASON FOR EXAM:    pre peg placement  COMMENTS:   Bedside (portable):Y    PROCEDURE: DXR - DXR KIDNEY URETER BLADDER  - May 18 2014  7:01AM     CLINICAL DATA:  Pre peg tube placement.  Evaluate gas  pattern.    EXAM:  ABDOMEN - 1 VIEW    COMPARISON:05/09/2014    FINDINGS:  Nasogastric tube in the distal stomach region. Majority of the colon  is gas-filled. Nonobstructive bowel gas pattern. Difficult to  exclude right basilar lung densities. No large abdominal  calcifications.     IMPRESSION:  Nonobstructive bowel gas pattern.    Nasogastric tube in the distal stomach region.      Electronically Signed    By: Richarda Overlie M.D.    On: 05/18/2014 08:29         Verified By: Arn Medal, M.D.,   Assessment/Plan:  Assessment/Plan:  Assessment 1) new diagnosis hepatitis c. 2)  AMS-question of possible serotonin syndrome.  Citalopram was stopped.   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   Plan 1) case discussed with anesthesia-since this is a non-urgent peg, request to hold on proceedure to allow reassesment and hopefully clearance of some current sx.  Hopefully will be able to be done tomorrow pm.   Electronic Signatures: Barnetta Chapel (MD)  (Signed 09-Dec-15 14:28)  Authored: Chief Complaint, VITAL SIGNS/ANCILLARY NOTES, Brief Assessment, Lab Results, Radiology Results, Assessment/Plan   Last Updated: 09-Dec-15 14:28 by Barnetta Chapel (MD)

## 2014-10-02 NOTE — Op Note (Signed)
PATIENT NAME:  Edward Durham, Edward Durham MR#:  [id number] DATE OF BIRTH:  Dec 31, 1965  DATE OF PROCEDURE:  08/29/2011  PREOPERATIVE DIAGNOSIS: Cervical spondylosis with radiculopathy.   POSTOPERATIVE DIAGNOSIS: Cervical spondylosis with radiculopathy.   PROCEDURES:  1. Anterior cervical diskectomy and fusion with removal of posterior osteophytes C4-5.  2. Insertion of interbody device C4-5.  3. Insertion of anterior plate [id number]. 4. Removal of anterior cervical fixation anterior plate J1-9C5-6 and J4-7C6-7.   SURGEON: Winn JockJames C. Gerrit Heckaliff, MD   ASSISTANT: Cranston Neighborhris Gaines, PA-C   ANESTHESIA: General.   ESTIMATED BLOOD LOSS: 25 mL.  REPLACEMENT: 1200 mL of Crystalloid.   DRAINS: One Hemovac.   COMPLICATIONS: None.   IMPLANTS USED: Zimmer 6 mm angled trabecular metal implant, Trinica plate, CopiOs bone void filler.   BRIEF CLINICAL NOTE AND PATHOLOGY: The patient had developed increasing neck pain with radiculopathy after having remote anterior cervical fusion at C5-6 and C6-7 [place]. Work-up showed evidence of progressive severe degenerative change at C4-5 above the level of prior fusion, significant foraminal stenosis. Options, risks, and benefits were discussed with the patient and he elected to proceed with operative intervention.   At the time of the procedure, the fusion at C5-6 and C6-7 was solid. There were marked degenerative changes at C4-5.   PROCEDURE: Adequate general anesthesia, supine position, routine prepping and draping. Roll placed under the posterior aspect of the cervical spine, head halter traction used. Lateral fluoroscopy was used. The previous incision was marked.   The old incision on the right side of the neck was used and was extended a little bit more medially and laterally. The subcutaneous tissues were carefully dissected. Scar tissue was removed. The interval carefully developed down to the anterior cervical spine and the plate. Trachea and esophagus were carefully  retracted to the left side. Self-retaining retractor was used. The scar tissue was removed from the anterior plate, the screws were removed and the plate was removed without difficulty. The fusion at C5-6 and C6-7 was inspected and was solid.   The microscope was then brought into the field. Distraction pins were placed in C4 and C5. Distraction was placed across the anterior aspect of the disk. The annulus was incised. The disk space was cleaned with a combination of soft tissue instruments and the bur. The endplates were prepared. Posterior osteophytes were removed with the Kerrison. Longitudinal ligament was removed, posterior disk material was removed and lateral decompression was performed. Incision was irrigated throughout the procedure with antibiotic solution.   Sizing was then performed, the rasp was used. The incision was again thoroughly irrigated. Surgiflo was placed. The implant was then inserted with CopiOs placed in its midportion. It seated very nicely.   An anterior plate was then applied in a routine fashion using AP and lateral fluoroscopic guidance. The screws were extremely secure. The locking mechanisms were engaged.   Soft tissues were inspected and were without damage. The incision was thoroughly irrigated, sub-Q was closed with 2-0 and 4-0 Vicryl, skin closed with glue. Soft sterile dressing was applied. Sponge and needle counts were reported as correct prior to and after wound closure.   ____________________________ Winn JockJames C. Gerrit Heckaliff, MD jcc:drc D: 09/01/2011 14:44:53 ET T: 09/01/2011 14:59:34 ET JOB#: [id number]  cc: Winn JockJames C. Gerrit Heckaliff, MD, <Dictator> Winn JockJAMES C Elis Sauber MD ELECTRONICALLY SIGNED 09/02/2011 9:21
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
## 2014-10-05 NOTE — Consult Note (Signed)
PATIENT NAME:  Edward Durham, Tor V MR#:  [id number] DATE OF BIRTH:  1965/06/16  DATE OF CONSULTATION:  04/28/2014  REFERRING PHYSICIAN:  Dr Sherryll BurgerShah CONSULTING PHYSICIAN:  Stann Mainlandavid P. Sampson GoonFitzgerald, MD  REASON FOR CONSULTATION: Sepsis and respiratory failure.   HISTORY OF PRESENT ILLNESS: This is a 49 year old gentleman initially admitted November 15 with cough and shortness of breath, but he left AMA. He was readmitted with altered mental status and sepsis. He has a history of depression, osteoarthritis, and chronic pain and has recently started on methadone program. He came to the ER November 15 with cough and shortness of breath for 3-5 days. He had had brownish sputum. He was found to have a temperature of 102 and hypoxia with saturations in the 80s.  He also had elevated white count. The patient, however, left AMA after being started on antibiotics with Rocephin, azithromycin, and also on Solu-Medrol. He was readmitted November 16 with altered mental status. He was intubated in the Emergency Room. He was found hypotensive and tachycardic. He was also found to have positive blood cultures with gram-negative rods.   PAST MEDICAL HISTORY: Depression, osteoarthritis, chronic back pain.   PAST SURGICAL HISTORY: Right thumb amputation, back surgery, neck surgery, and left heel surgery.   SOCIAL HISTORY: Smokes a pack a day, occasional alcohol, uses chronic pain meds and currently on a methadone program.   ALLERGIES: MORPHINE.   OUTPATIENT MEDICATIONS: Include Celexa, methadone, omeprazole, and trazodone. Currently he is on vancomycin, Zosyn, and levofloxacin.   FAMILY HISTORY: Noncontributory.   REVIEW OF SYSTEMS: Unable to be obtained.   PHYSICAL EXAMINATION:  VITAL SIGNS: Temperature 98.5, pulse 80, blood pressure 98/57, respirations 12, saturating 94% on ventilator. He has been afebrile since admission at which point his temperature was 100.2.  GENERAL: He is agitated, critically ill-appearing, has a  ventilator and ET tube in place.  EYES: Pupils reactive.  HEART: Tachy but regular.  LUNGS: Coarse breath sounds bilaterally.  ABDOMEN: Soft, nontender, nondistended. No hepatosplenomegaly.  EXTREMITIES: No clubbing, cyanosis, or edema.   DATA: CT of the abdomen and pelvis showed small bibasilar effusions. There was some free fluid in the pelvis and rectal wall thickening consistent with prostatitis. Mildly prominent stool in the colon. There is some retroperitoneal fluid identified in the left pelvis of uncertain etiology. Chest x-ray November 18 showed stable lines and tubes and patchy increased opacity in the left mid lung suspicious for pneumonia. White blood count currently is 6.0, on November 14 it was 7.7; hemoglobin 10.6; platelets 238,000. Blood cultures November 14,  are growing gram-negative rods yet to be identified. Influenza PCR was negative. Followup blood cultures November 16 are no growth to date. Urine culture November 16 negative. Sputum culture is growing rare yeast as well as Staphylococcus aureus. Urinalysis was negative. Toxicology screen was positive for benzodiazepines, opiates,  methadone.   IMPRESSION: A 49 year old gentleman with substance abuse admitted with cough, fever, and shortness of breath and found to gram-negative rod bacteremia. He also has Staphylococcus aureus growing in his sputum. He has an abnormal CT of his abdomen and pelvis as well with some free fluid.   RECOMMENDATIONS:  1. Continue vancomycin, Zosyn, and levofloxacin pending further identification of organism.  2. I will ask the lab to speciate the gram-negative rods.  3. Continue critical care management per primary team.   Thank you for the consultation. I will be glad to follow with you.    ____________________________ Stann Mainlandavid P. Sampson GoonFitzgerald, MD dpf:bm D: 04/28/2014 20:55:04 ET T:  04/29/2014 03:47:58 ET JOB#: [id number]  cc: Stann Mainland. Sampson Goon, MD, <Dictator> Jayton Popelka Sampson Goon MD ELECTRONICALLY  SIGNED 06/12/2014 21:25

## 2015-02-27 ENCOUNTER — Emergency Department: Payer: BC Managed Care – PPO

## 2015-02-27 ENCOUNTER — Inpatient Hospital Stay
Admission: EM | Admit: 2015-02-27 | Discharge: 2015-03-01 | DRG: 193 | Disposition: A | Discharge status: Home / Self Care | Payer: BC Managed Care – PPO | Attending: Internal Medicine | Admitting: Internal Medicine

## 2015-02-27 ENCOUNTER — Encounter: Payer: Self-pay | Admitting: Emergency Medicine

## 2015-02-27 DIAGNOSIS — J449 Chronic obstructive pulmonary disease, unspecified: Secondary | ICD-10-CM | POA: Diagnosis present

## 2015-02-27 DIAGNOSIS — Z86718 Personal history of other venous thrombosis and embolism: Secondary | ICD-10-CM | POA: Diagnosis not present

## 2015-02-27 DIAGNOSIS — Z716 Tobacco abuse counseling: Secondary | ICD-10-CM | POA: Diagnosis present

## 2015-02-27 DIAGNOSIS — A419 Sepsis, unspecified organism: Secondary | ICD-10-CM

## 2015-02-27 DIAGNOSIS — F329 Major depressive disorder, single episode, unspecified: Secondary | ICD-10-CM | POA: Diagnosis present

## 2015-02-27 DIAGNOSIS — J189 Pneumonia, unspecified organism: Secondary | ICD-10-CM | POA: Diagnosis present

## 2015-02-27 DIAGNOSIS — J9601 Acute respiratory failure with hypoxia: Secondary | ICD-10-CM | POA: Diagnosis present

## 2015-02-27 DIAGNOSIS — Z79899 Other long term (current) drug therapy: Secondary | ICD-10-CM | POA: Diagnosis not present

## 2015-02-27 DIAGNOSIS — F17210 Nicotine dependence, cigarettes, uncomplicated: Secondary | ICD-10-CM | POA: Diagnosis present

## 2015-02-27 DIAGNOSIS — B1920 Unspecified viral hepatitis C without hepatic coma: Secondary | ICD-10-CM | POA: Diagnosis present

## 2015-02-27 HISTORY — DX: Major depressive disorder, single episode, unspecified: F32.9

## 2015-02-27 HISTORY — DX: Pneumonia, unspecified organism: J18.9

## 2015-02-27 HISTORY — DX: Depression, unspecified: F32.A

## 2015-02-27 LAB — BASIC METABOLIC PANEL
Anion gap: 9 (ref 5–15)
BUN: 6 mg/dL (ref 6–20)
CHLORIDE: 97 mmol/L — AB (ref 101–111)
CO2: 26 mmol/L (ref 22–32)
CREATININE: 1.19 mg/dL (ref 0.61–1.24)
Calcium: 8 mg/dL — ABNORMAL LOW (ref 8.9–10.3)
GFR calc Af Amer: >60 mL/min (ref >60)
GFR calc non Af Amer: >60 mL/min (ref >60)
Glucose, Bld: 213 mg/dL — ABNORMAL HIGH (ref 65–99)
Potassium: 3.6 mmol/L (ref 3.5–5.1)
Sodium: 132 mmol/L — ABNORMAL LOW (ref 135–145)

## 2015-02-27 LAB — URINALYSIS COMPLETE WITH MICROSCOPIC (ARMC ONLY)
BACTERIA UA: NONE SEEN
BILIRUBIN URINE: NEGATIVE
Glucose, UA: NEGATIVE mg/dL
Ketones, ur: NEGATIVE mg/dL
LEUKOCYTES UA: NEGATIVE
NITRITE: NEGATIVE
PH: 8 (ref 5.0–8.0)
Protein, ur: NEGATIVE mg/dL
Specific Gravity, Urine: 1.005 (ref 1.005–1.030)

## 2015-02-27 LAB — CBC WITH DIFFERENTIAL/PLATELET
Basophils Absolute: 0 10*3/uL (ref 0–0.1)
Basophils Relative: 0 %
EOS ABS: 0.4 10*3/uL (ref 0–0.7)
Eosinophils Relative: 2 %
HEMATOCRIT: 32.5 % — AB (ref 40.0–52.0)
HEMOGLOBIN: 10.7 g/dL — AB (ref 13.0–18.0)
LYMPHS ABS: 1.6 10*3/uL (ref 1.0–3.6)
Lymphocytes Relative: 9 %
MCH: 30.2 pg (ref 26.0–34.0)
MCHC: 33 g/dL (ref 32.0–36.0)
MCV: 91.5 fL (ref 80.0–100.0)
MONOS PCT: 6 %
Monocytes Absolute: 0.9 10*3/uL (ref 0.2–1.0)
NEUTROS ABS: 14.1 10*3/uL — AB (ref 1.4–6.5)
NEUTROS PCT: 83 %
Platelets: 299 10*3/uL (ref 150–440)
RBC: 3.55 MIL/uL — ABNORMAL LOW (ref 4.40–5.90)
RDW: 15.8 % — ABNORMAL HIGH (ref 11.5–14.5)
WBC: 17 10*3/uL — ABNORMAL HIGH (ref 3.8–10.6)

## 2015-02-27 LAB — APTT: aPTT: 43 seconds — ABNORMAL HIGH (ref 24–36)

## 2015-02-27 LAB — PROTIME-INR
INR: 1.08
Prothrombin Time: 14.2 seconds (ref 11.4–15.0)

## 2015-02-27 LAB — TROPONIN I: Troponin I: <0.03 ng/mL (ref <0.031)

## 2015-02-27 LAB — LACTIC ACID, PLASMA
Lactic Acid, Venous: 1.3 mmol/L (ref 0.5–2.0)
Lactic Acid, Venous: 0.9 mmol/L (ref 0.5–2.0)

## 2015-02-27 MED ORDER — NICOTINE 21 MG/24HR TD PT24
21.0000 mg | MEDICATED_PATCH | Freq: Every day | TRANSDERMAL | Status: DC
  Administered 2015-02-27 – 2015-03-01 (×3): 21 mg via TRANSDERMAL
  Filled 2015-02-27 (×3): qty 1

## 2015-02-27 MED ORDER — DEXTROSE 5 % IV SOLN
500.0000 mg | INTRAVENOUS | Status: DC
  Administered 2015-02-28: 500 mg via INTRAVENOUS
  Filled 2015-02-27 (×2): qty 500

## 2015-02-27 MED ORDER — ONDANSETRON HCL 4 MG/2ML IJ SOLN: 4.0000 mg | Freq: Four times a day (QID) | INTRAMUSCULAR | Status: DC | PRN

## 2015-02-27 MED ORDER — SODIUM CHLORIDE 0.9 % IV BOLUS (SEPSIS)
1000.0000 mL | Freq: Once | INTRAVENOUS | Status: AC
  Administered 2015-02-27: 1000 mL via INTRAVENOUS
  Filled 2015-02-27: qty 1000

## 2015-02-27 MED ORDER — TIOTROPIUM BROMIDE MONOHYDRATE 18 MCG IN CAPS
1.0000 | ORAL_CAPSULE | Freq: Every day | RESPIRATORY_TRACT | Status: DC
  Administered 2015-02-28 – 2015-03-01 (×2): 18 ug via RESPIRATORY_TRACT
  Filled 2015-02-27: qty 5

## 2015-02-27 MED ORDER — ACETAMINOPHEN 325 MG PO TABS: 650.0000 mg | ORAL_TABLET | Freq: Four times a day (QID) | ORAL | Status: DC | PRN

## 2015-02-27 MED ORDER — CITALOPRAM HYDROBROMIDE 20 MG PO TABS
20.0000 mg | ORAL_TABLET | Freq: Every day | ORAL | Status: DC
  Administered 2015-02-28 – 2015-03-01 (×2): 20 mg via ORAL
  Filled 2015-02-27 (×2): qty 1

## 2015-02-27 MED ORDER — DEXTROSE 5 % IV SOLN
1.0000 g | INTRAVENOUS | Status: DC
  Administered 2015-02-28 – 2015-03-01 (×2): 1 g via INTRAVENOUS
  Filled 2015-02-27 (×3): qty 10

## 2015-02-27 MED ORDER — DEXTROSE 5 % IV SOLN
1.0000 g | Freq: Once | INTRAVENOUS | Status: AC
  Administered 2015-02-27: 1 g via INTRAVENOUS
  Filled 2015-02-27: qty 10

## 2015-02-27 MED ORDER — CYCLOBENZAPRINE HCL 10 MG PO TABS
10.0000 mg | ORAL_TABLET | Freq: Three times a day (TID) | ORAL | Status: DC
  Administered 2015-02-27 – 2015-03-01 (×5): 10 mg via ORAL
  Filled 2015-02-27 (×6): qty 1

## 2015-02-27 MED ORDER — LEVALBUTEROL HCL 0.63 MG/3ML IN NEBU
0.6300 mg | INHALATION_SOLUTION | Freq: Four times a day (QID) | RESPIRATORY_TRACT | Status: DC
  Administered 2015-02-27 – 2015-02-28 (×5): 0.63 mg via RESPIRATORY_TRACT
  Filled 2015-02-27 (×10): qty 3

## 2015-02-27 MED ORDER — CYCLOBENZAPRINE HCL 10 MG PO TABS: 5.0000 mg | ORAL_TABLET | Freq: Once | ORAL | Status: AC

## 2015-02-27 MED ORDER — SODIUM CHLORIDE 0.9 % IV BOLUS (SEPSIS)
1430.0000 mL | Freq: Once | INTRAVENOUS | Status: AC
  Administered 2015-02-27: 1000 mL via INTRAVENOUS

## 2015-02-27 MED ORDER — CLONAZEPAM 1 MG PO TABS
1.0000 mg | ORAL_TABLET | Freq: Every day | ORAL | Status: DC
  Administered 2015-02-27 – 2015-02-28 (×2): 1 mg via ORAL
  Filled 2015-02-27 (×2): qty 1

## 2015-02-27 MED ORDER — CYCLOBENZAPRINE HCL 10 MG PO TABS
ORAL_TABLET | ORAL | Status: AC
  Administered 2015-02-27: 5 mg
  Filled 2015-02-27: qty 1

## 2015-02-27 MED ORDER — ACETAMINOPHEN 650 MG RE SUPP: 650.0000 mg | Freq: Four times a day (QID) | RECTAL | Status: DC | PRN

## 2015-02-27 MED ORDER — TRAZODONE HCL 50 MG PO TABS: 50.0000 mg | ORAL_TABLET | Freq: Every evening | ORAL | Status: DC | PRN

## 2015-02-27 MED ORDER — METHYLPREDNISOLONE SODIUM SUCC 40 MG IJ SOLR
40.0000 mg | INTRAMUSCULAR | Status: DC
  Administered 2015-02-27 – 2015-02-28 (×6): 40 mg via INTRAVENOUS
  Filled 2015-02-27 (×7): qty 1

## 2015-02-27 MED ORDER — QUETIAPINE FUMARATE 100 MG PO TABS
100.0000 mg | ORAL_TABLET | Freq: Every day | ORAL | Status: DC
  Administered 2015-02-27 – 2015-02-28 (×2): 100 mg via ORAL
  Filled 2015-02-27 (×2): qty 1

## 2015-02-27 MED ORDER — HYDROCODONE-ACETAMINOPHEN 7.5-325 MG PO TABS
1.0000 | ORAL_TABLET | Freq: Three times a day (TID) | ORAL | Status: DC
  Administered 2015-02-27 – 2015-03-01 (×5): 1 via ORAL
  Filled 2015-02-27 (×6): qty 1

## 2015-02-27 MED ORDER — ENOXAPARIN SODIUM 40 MG/0.4ML ~~LOC~~ SOLN
40.0000 mg | SUBCUTANEOUS | Status: DC
  Administered 2015-02-27 – 2015-03-01 (×3): 40 mg via SUBCUTANEOUS
  Filled 2015-02-27 (×3): qty 0.4

## 2015-02-27 MED ORDER — AZITHROMYCIN 500 MG IV SOLR
500.0000 mg | Freq: Once | INTRAVENOUS | Status: AC
  Administered 2015-02-27: 500 mg via INTRAVENOUS
  Filled 2015-02-27: qty 500

## 2015-02-27 MED ORDER — ONDANSETRON HCL 4 MG PO TABS: 4.0000 mg | ORAL_TABLET | Freq: Four times a day (QID) | ORAL | Status: DC | PRN

## 2015-02-27 NOTE — Progress Notes (Signed)
Report given to Wynn Banker, RN  Cristela Felt, RN

## 2015-02-27 NOTE — ED Provider Notes (Signed)
[facility] Emergency Department Provider Note  ____________________________________________  Time seen: Approximately 8:38 AM  I have reviewed the triage vital signs and the nursing notes.   HISTORY  Chief Complaint Shortness of Breath    HPI Edward Durham is a 49 y.o. 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 He reports over the past several days he has began to have fevers at home. No chest pain. No vomiting, diarrhea. Current severity of symptoms is moderate. No modifying factors.   Past Medical History  Diagnosis Date  . Pneumonia   . Depression     There are no active problems to display for this patient.   Past Surgical History  Procedure Laterality Date  . Cervical spine surgery      Current Outpatient Rx  Name  Route  Sig  Dispense  Refill  . citalopram (CELEXA) 20 MG tablet   Oral   Take 20 mg by mouth daily.         . clonazePAM (KLONOPIN) 1 MG tablet   Oral   Take 1 mg by mouth at bedtime as needed for anxiety.          Marland Kitchen HYDROcodone-acetaminophen (NORCO) 7.5-325 MG per tablet   Oral   Take 1 tablet by mouth every 8 (eight) hours. Every 8 to 12 hours         . HYSINGLA ER 20 MG T24A   Oral   Take 1 tablet by mouth daily.           Dispense as written.   Marland Kitchen ibuprofen (ADVIL,MOTRIN) 400 MG tablet   Oral   Take 400 mg by mouth every 12 (twelve) hours as needed for mild pain.         Marland Kitchen PROAIR HFA 108 (90 BASE) MCG/ACT inhaler   Inhalation   Inhale 2 puffs into the lungs every 4 (four) hours as needed for wheezing.            Dispense as written.   Marland Kitchen QUEtiapine (SEROQUEL) 100 MG tablet   Oral   Take 100 mg by mouth at bedtime.         Marland Kitchen SPIRIVA HANDIHALER 18 MCG inhalation capsule   Inhalation   Place 1 capsule into  inhaler and inhale daily.           Dispense as written.   . traZODone (DESYREL) 50 MG tablet   Oral   Take 50 mg by mouth at bedtime as needed for sleep.            Allergies Review of patient's allergies indicates no known allergies.  History reviewed. No pertinent family history.  Social History Social History  Substance Use Topics  . Smoking status: Current Every Day Smoker  . Smokeless tobacco: None  . Alcohol Use: No    Review of Systems Constitutional: + fever/chills Eyes: No visual changes. ENT: No sore throat. Cardiovascular: Denies chest pain. Respiratory: + shortness of breath. Gastrointestinal: No abdominal pain.  No nausea, no vomiting.  No diarrhea.  No constipation. Genitourinary: Negative for dysuria. Musculoskeletal: Negative for back pain. Skin: Negative for rash. Neurological: Negative for headaches, focal weakness or numbness.  10-point ROS otherwise negative.  ____________________________________________   PHYSICAL EXAM:  VITAL SIGNS: ED Triage Vitals  Enc Vitals Group  BP 02/27/15 0810 123/56 mmHg     Pulse Rate 02/27/15 0810 109     Resp 02/27/15 0810 18     Temp 02/27/15 0810 99 F (37.2 C)     Temp Source 02/27/15 0810 Oral     SpO2 02/27/15 0810 94 %     Weight 02/27/15 0810 180 lb (81.647 kg)     Height 02/27/15 0810  (1.702 m)     Head Cir --      Peak Flow --      Pain Score 02/27/15 0814 9     Pain Loc --      Pain Edu? --      Excl. in GC? --     Constitutional: Alert and oriented. Appears fatigued but nontoxic. Eyes: Conjunctivae are normal. PERRL. EOMI. Head: Atraumatic. Nose: No congestion/rhinnorhea. Mouth/Throat: Mucous membranes are moist.  Oropharynx non-erythematous. Neck: No stridor.  Cardiovascular: tachycardic rate, regular rhythm. Grossly normal heart sounds.  Good peripheral circulation. Respiratory: Normal respiratory effort. Diminished breath sounds throughout the left lung  fields. Gastrointestinal: Soft and nontender. No distention. No abdominal bruits. No CVA tenderness. Genitourinary: deferred Musculoskeletal: No lower extremity tenderness nor edema.  No joint effusions. Neurologic:  Normal speech and language. No gross focal neurologic deficits are appreciated. No gait instability. Skin:  Skin is warm, dry and intact. No rash noted. Psychiatric: Mood and affect are normal. Speech and behavior are normal.  ____________________________________________   LABS (all labs ordered are listed, but only abnormal results are displayed)  Labs Reviewed  CBC WITH DIFFERENTIAL/PLATELET - Abnormal; Notable for the following:    WBC 17.0 (*)    RBC 3.55 (*)    Hemoglobin 10.7 (*)    HCT 32.5 (*)    RDW 15.8 (*)    Neutro Abs 14.1 (*)    All other components within normal limits  BASIC METABOLIC PANEL - Abnormal; Notable for the following:    Sodium 132 (*)    Chloride 97 (*)    Glucose, Bld 213 (*)    Calcium 8.0 (*)    All other components within normal limits  CULTURE, BLOOD (ROUTINE X 2)  CULTURE, BLOOD (ROUTINE X 2)  URINE CULTURE  TROPONIN I  LACTIC ACID, PLASMA  PROTIME-INR  APTT  LACTIC ACID, PLASMA  URINALYSIS COMPLETEWITH MICROSCOPIC ([facility] ONLY)   ____________________________________________  EKG  ED ECG REPORT I, Gayla Doss, the attending physician, personally viewed and interpreted this ECG.   Date: 02/27/2015  EKG Time: 08:21  Rate: 103  Rhythm: sinus tachycardia  Axis: normal  Intervals:none  ST&T Change: No acute ST elevation. ____________________________________________  RADIOLOGY  CXR IMPRESSION: Multifocal bilateral infiltrates consistent with pneumonia. Pulmonary infiltrate left mid lung field is particularly severe. Underlying mass lesion cannot be excluded. Followup PA and lateral chest X-ray is recommended in 3-4 weeks following trial of antibiotic therapy to ensure resolution and exclude  underlying malignancy.  ____________________________________________   PROCEDURES  Procedure(s) performed: None  Critical Care performed: Yes, see critical care note(s). Total critical care time spent 30 minutes.  ____________________________________________   INITIAL IMPRESSION / ASSESSMENT AND PLAN / ED COURSE  Pertinent labs & imaging results that were available during my care of the patient were reviewed by me and considered in my medical decision making (see chart for details).  Edward Durham is a 49 y.o. male with history of COPD, prior history of upper extremity DVT now off of Coumadin, hepatitis C who presents for evaluation of  2 weeks gradual onset constant productive cough, shortness of breath. On exam, he appears fatigued. He has dementia breath sounds throughout the left lung field and is tachycardic as well as intermittently tachypnea. He is meeting 3 out of 4 systemic inflammatory response criteria and his x-ray is concerning for multifocal pneumonia. We'll treat with liberal IV fluids, ceftriaxone and azithromycin sepsis due to community acquired pneumonia. Case discussed with hospitalist, Dr. Clent Ridges at 9:57 AM for admission. ____________________________________________   FINAL CLINICAL IMPRESSION(S) / ED DIAGNOSES  Final diagnoses:  Pneumonia, organism unspecified  Sepsis, due to unspecified organism      Gayla Doss, MD 02/27/15 1014

## 2015-02-27 NOTE — ED Notes (Signed)
IV abx from pyxis scanned and given per order in appropriate saline mixes.

## 2015-02-27 NOTE — ED Notes (Signed)
Pt to ed with c/o cough, congestion, brown sputum, and shortness of breath for several days. Reports he got pneumonia on Sept 2nd and has not felt good since.  Pt with fever of 99.0 at triage at this time.  Pt skin hot to touch.  +smoker.

## 2015-02-27 NOTE — Progress Notes (Signed)
Per MD Elisabeth Pigeon order flexeril for patient TID

## 2015-02-27 NOTE — H&P (Signed)
[facility] Physicians - [place] at [facility]   PATIENT NAME: Edward Durham    MR#:  [id number]  DATE OF BIRTH:  09/15/1965  DATE OF ADMISSION:  02/27/2015  PRIMARY CARE PHYSICIAN: No primary care provider on file.   REQUESTING/REFERRING PHYSICIAN: Dr. Chari Manning  CHIEF COMPLAINT: Shortness of breath, cough    Chief Complaint  Patient presents with  . Shortness of Breath    HISTORY OF PRESENT ILLNESS:  Efren Kross  is a 49 y.o. male with a known history of PD, hepatitis C comes in because of shortness of breath, cough and fever for 10 days, Patient having Cough with brown sputum associated with pleuritic chest pain. Having low-grade fever. Chest x-ray showed pneumonia. He was hypoxic with O2 sats 89%, tachycardic with heart rate 1 34 bpm in the ER.  PAST MEDICAL HISTORY:   Past Medical History  Diagnosis Date  . Pneumonia   . Depression     PAST SURGICAL HISTOIRY:   Past Surgical History  Procedure Laterality Date  . Cervical spine surgery      SOCIAL HISTORY:   Social History  Substance Use Topics  . Smoking status: Current Every Day Smoker  . Smokeless tobacco: Not on file  . Alcohol Use: No    FAMILY HISTORY:  History reviewed. No pertinent family history.  DRUG ALLERGIES:  No Known Allergies  REVIEW OF SYSTEMS:  CONSTITUTIONAL: No fever, fatigue or weakness.  EYES: No blurred or double vision.  EARS, NOSE, AND THROAT: No tinnitus or ear pain.  RESPIRATORY: Cough, shortness of breath for 10 days. Associated with brown-colored phlegm. CARDIOVASCULAR: No chest pain, orthopnea, edema.  GASTROINTESTINAL: No nausea, vomiting, diarrhea or abdominal pain.  GENITOURINARY: No dysuria, hematuria.  ENDOCRINE: No polyuria, nocturia,  HEMATOLOGY: No anemia, easy bruising or bleeding SKIN: No rash or lesion. MUSCULOSKELETAL: No joint pain or arthritis.   NEUROLOGIC: No tingling, numbness, weakness.  PSYCHIATRY: History of depression.   MEDICATIONS AT HOME:   Prior to Admission medications   Medication Sig Start Date End Date Taking? Authorizing Provider  citalopram (CELEXA) 20 MG tablet Take 20 mg by mouth daily.   Yes Historical Provider, MD  clonazePAM (KLONOPIN) 1 MG tablet Take 1 mg by mouth at bedtime as needed for anxiety.    Yes Historical Provider, MD  HYDROcodone-acetaminophen (NORCO) 7.5-325 MG per tablet Take 1 tablet by mouth every 8 (eight) hours. Every 8 to 12 hours   Yes Historical Provider, MD  [facility] ER 20 MG T24A Take 1 tablet by mouth daily.   Yes Historical Provider, MD  ibuprofen (ADVIL,MOTRIN) 400 MG tablet Take 400 mg by mouth every 12 (twelve) hours as needed for mild pain.   Yes Historical Provider, MD  PROAIR HFA 108 (90 BASE) MCG/ACT inhaler Inhale 2 puffs into the lungs every 4 (four) hours as needed for wheezing.    Yes Historical Provider, MD  QUEtiapine (SEROQUEL) 100 MG tablet Take 100 mg by mouth at bedtime.   Yes Historical Provider, MD  SPIRIVA HANDIHALER 18 MCG inhalation capsule Place 1 capsule into inhaler and inhale daily.   Yes Historical Provider, MD  traZODone (DESYREL) 50 MG tablet Take 50 mg by mouth at bedtime as needed for sleep.    Yes Historical Provider, MD      VITAL SIGNS:  Blood pressure 108/90, pulse 96, temperature 98.1 F (36.7 C), temperature source Oral, resp. rate 24, height  (1.702 m), weight 81.647 kg (180 lb), SpO2 97 %.  PHYSICAL EXAMINATION:  GENERAL:  49 y.o.-year-old patient lying in the bed with no acute distress.  EYES: Pupils equal, round, reactive to light and accommodation. No scleral icterus. Extraocular muscles intact.  HEENT: Head atraumatic, normocephalic. Oropharynx and nasopharynx clear.  NECK:  Supple, no jugular venous distention. No thyroid enlargement, no tenderness.  LUNGS: Normal breath sounds bilaterally, no wheezing, rales,rhonchi or crepitation. No use of accessory muscles of respiration.  CARDIOVASCULAR: S1, S2 normal. No  murmurs, rubs, or gallops.  ABDOMEN: Soft, nontender, nondistended. Bowel sounds present. No organomegaly or mass.  EXTREMITIES: No pedal edema, cyanosis, or clubbing.  NEUROLOGIC: Cranial nerves II through XII are intact. Muscle strength 5/5 in all extremities. Sensation intact. Gait not checked.  PSYCHIATRIC: The patient is alert and oriented x 3.  SKIN: No obvious rash, lesion, or ulcer.   LABORATORY PANEL:   CBC  Recent Labs Lab 02/27/15 0825  WBC 17.0*  HGB 10.7*  HCT 32.5*  PLT 299   ------------------------------------------------------------------------------------------------------------------  Chemistries   Recent Labs Lab 02/27/15 0825  NA 132*  K 3.6  CL 97*  CO2 26  GLUCOSE 213*  BUN 6  CREATININE 1.19  CALCIUM 8.0*   ------------------------------------------------------------------------------------------------------------------  Cardiac Enzymes  Recent Labs Lab 02/27/15 0825  TROPONINI <0.03   ------------------------------------------------------------------------------------------------------------------  RADIOLOGY:  Dg Chest 2 View  02/27/2015   CLINICAL DATA:  Cough.  Pneumonia.  EXAM: CHEST  2 VIEW  COMPARISON:  05/30/2014.  FINDINGS: Mediastinum hilar structures are normal. Prominent left mid lung field pulmonary infiltrate noted consistent with pneumonia. Mild right lung base infiltrate present consistent with pneumonia. Heart size normal. No pleural effusion or pneumothorax . No acute bony abnormality. Prior cervical spine fusion.  IMPRESSION: Multifocal bilateral infiltrates consistent with pneumonia. Pulmonary infiltrate left mid lung field is particularly severe. Underlying mass lesion cannot be excluded. Followup PA and lateral chest X-ray is recommended in 3-4 weeks following trial of antibiotic therapy to ensure resolution and exclude underlying malignancy.   Electronically Signed   By: Maisie Fus  Register   On: 02/27/2015 08:46    EKG:    Orders placed or performed during the hospital encounter of 02/27/15  . ED EKG  . ED EKG  . EKG 12-Lead  . EKG 12-Lead    IMPRESSION AND PLAN:   #1. Acute hypoxic respiratory failure secondary to multifocal pneumonia likely community-acquired: Continue oxygen, patient received Rocephin and Zithromax in the ER will continue them  follow sputum cultures. WBC up at 17. #2 COPD, history of heavy smoking: Not much wheezing at this time. Continues. spiriva , nebulizers. 3.SIRS"present on admission secondary to pneumonia: Continue antibiotics , IV hydration.  #4 depression continue home medications #5 history of 5.hepatitis C recent diagnosis follows up with [facility] 6.Tobacco abuse counseled against smoking. Offered assistance counseled  for about 10 minutes.  All the records are reviewed and case discussed with ED provider. Management plans discussed with the patient, family and they are in agreement.  CODE STATUS: Full   TOTAL TIME TAKING CARE OF THIS PATIENT:    KONIDENA,SNEHALATHA M.D on 02/27/2015 at 10:51 AM  Between 7am to 6pm - Pager - [phone]  After 6pm go to [email or web address] - password [email or web address] [facility]  [facility]  Office  [phone]  CC: Primary care physician; No primary care provider on file.

## 2015-02-28 LAB — COMPREHENSIVE METABOLIC PANEL
ALBUMIN: 2.8 g/dL — AB (ref 3.5–5.0)
ALT: 30 U/L (ref 17–63)
AST: 38 U/L (ref 15–41)
Alkaline Phosphatase: 70 U/L (ref 38–126)
Anion gap: 9 (ref 5–15)
BILIRUBIN TOTAL: 0.4 mg/dL (ref 0.3–1.2)
BUN: 10 mg/dL (ref 6–20)
CO2: 24 mmol/L (ref 22–32)
CREATININE: 0.98 mg/dL (ref 0.61–1.24)
Calcium: 8.4 mg/dL — ABNORMAL LOW (ref 8.9–10.3)
Chloride: 105 mmol/L (ref 101–111)
GFR calc Af Amer: >60 mL/min (ref >60)
GLUCOSE: 231 mg/dL — AB (ref 65–99)
Potassium: 3.8 mmol/L (ref 3.5–5.1)
Sodium: 138 mmol/L (ref 135–145)
TOTAL PROTEIN: 7.3 g/dL (ref 6.5–8.1)

## 2015-02-28 MED ORDER — PREDNISONE 20 MG PO TABS: 40.0000 mg | ORAL_TABLET | Freq: Every day | ORAL | Status: DC

## 2015-02-28 MED ORDER — PREDNISONE 50 MG PO TABS
60.0000 mg | ORAL_TABLET | Freq: Every day | ORAL | Status: AC
  Administered 2015-03-01: 60 mg via ORAL
  Filled 2015-02-28 (×2): qty 1

## 2015-02-28 MED ORDER — PREDNISONE 50 MG PO TABS: 50.0000 mg | ORAL_TABLET | Freq: Every day | ORAL | Status: DC

## 2015-02-28 MED ORDER — PREDNISONE 20 MG PO TABS: 20.0000 mg | ORAL_TABLET | Freq: Every day | ORAL | Status: DC

## 2015-02-28 MED ORDER — PREDNISONE 20 MG PO TABS: 30.0000 mg | ORAL_TABLET | Freq: Every day | ORAL | Status: DC

## 2015-02-28 MED ORDER — ALBUTEROL SULFATE (2.5 MG/3ML) 0.083% IN NEBU
2.5000 mg | INHALATION_SOLUTION | Freq: Three times a day (TID) | RESPIRATORY_TRACT | Status: DC
  Administered 2015-02-28 – 2015-03-01 (×2): 2.5 mg via RESPIRATORY_TRACT
  Filled 2015-02-28 (×2): qty 3

## 2015-02-28 MED ORDER — PREDNISONE 10 MG PO TABS: 10.0000 mg | ORAL_TABLET | Freq: Every day | ORAL | Status: DC

## 2015-02-28 MED ORDER — AZITHROMYCIN 250 MG PO TABS
500.0000 mg | ORAL_TABLET | Freq: Every day | ORAL | Status: DC
  Administered 2015-03-01: 500 mg via ORAL
  Filled 2015-02-28: qty 2

## 2015-02-28 NOTE — Progress Notes (Signed)
Inpatient Diabetes Program Recommendations  AACE/ADA: New Consensus Statement on Inpatient Glycemic Control (2015)  Target Ranges:  Prepandial:   less than 140 mg/dL      Peak postprandial:   less than 180 mg/dL (1-2 hours)      Critically ill patients:  140 - 180 mg/dL  Results for Edward Durham, Edward Durham (MRN [id number]) as of 02/28/2015 11:02  Ref. Range 02/27/2015 08:25 02/27/2015 09:23 02/27/2015 09:24 02/27/2015 09:29 02/27/2015 12:13 02/28/2015 06:52  Glucose Latest Ref Range: 65-99 mg/dL 409 (H)     811 (H)   Review of Glycemic Control  Diabetes history: none noted, A1C 5.5% on 05/27/14 Outpatient Diabetes medications: none Current orders for Inpatient glycemic control: none  Note that patient is on steroids and likely causing these elevated lab glucose.   Inpatient Diabetes Program Recommendations: Please consider checking an A1C and CBG tid and hs. Consider putting the patient on low dose Lantus insulin while he's on steroids; Lantus 8 units qhs (0.1unit/kg).   Susette Racer, RN, BA, MHA, CDE Diabetes Coordinator Inpatient Diabetes Program  [phone] (Team Pager) [phone] [facility] Office) 02/28/2015 11:05 AM

## 2015-02-28 NOTE — Progress Notes (Signed)
Report given to Janine Limbo, RN on 2C. CNA to prepare pt for transport.  Cristela Felt, RN

## 2015-02-28 NOTE — Progress Notes (Signed)
[facility] Physicians - [place] at [facility]   PATIENT NAME: Edward Durham    MR#:  [id number]  DATE OF BIRTH:  [birth year]  SUBJECTIVE:  CHIEF COMPLAINT:   Chief Complaint  Patient presents with  . Shortness of Breath   feels better today, still have some dry cough, and pain due to cough.  REVIEW OF SYSTEMS:  CONSTITUTIONAL: No fever, fatigue or weakness.  EYES: No blurred or double vision.  EARS, NOSE, AND THROAT: No tinnitus or ear pain.  RESPIRATORY: positive for cough & shortness of breath, no wheezing or hemoptysis.  CARDIOVASCULAR: positive for chest pain, no orthopnea, edema.  GASTROINTESTINAL: No nausea, vomiting, diarrhea or abdominal pain.  GENITOURINARY: No dysuria, hematuria.  ENDOCRINE: No polyuria, nocturia,  HEMATOLOGY: No anemia, easy bruising or bleeding SKIN: No rash or lesion. MUSCULOSKELETAL: No joint pain or arthritis.   NEUROLOGIC: No tingling, numbness, weakness.  PSYCHIATRY: No anxiety or depression.   ROS  DRUG ALLERGIES:  No Known Allergies  VITALS:  Blood pressure 108/72, pulse 81, temperature 97.9 F (36.6 C), temperature source Oral, resp. rate 20, height  (1.778 m), weight 81.239 kg (179 lb 1.6 oz), SpO2 100 %.  PHYSICAL EXAMINATION:  GENERAL:  49 y.o.-year-old patient lying in the bed with no acute distress.  EYES: Pupils equal, round, reactive to light and accommodation. No scleral icterus. Extraocular muscles intact.  HEENT: Head atraumatic, normocephalic. Oropharynx and nasopharynx clear.  NECK:  Supple, no jugular venous distention. No thyroid enlargement, no tenderness.  LUNGS: Normal breath sounds bilaterally, no wheezing, mild crepitation. No use of accessory muscles of respiration.  CARDIOVASCULAR: S1, S2 normal. No murmurs, rubs, or gallops.  ABDOMEN: Soft, nontender, nondistended. Bowel sounds present. No organomegaly or mass.  EXTREMITIES: No pedal edema, cyanosis, or clubbing.  NEUROLOGIC: Cranial nerves  II through XII are intact. Muscle strength 5/5 in all extremities. Sensation intact. Gait not checked.  PSYCHIATRIC: The patient is alert and oriented x 3.  SKIN: No obvious rash, lesion, or ulcer.   Physical Exam LABORATORY PANEL:   CBC  Recent Labs Lab 02/27/15 0825  WBC 17.0*  HGB 10.7*  HCT 32.5*  PLT 299   ------------------------------------------------------------------------------------------------------------------  Chemistries   Recent Labs Lab 02/28/15 0652  NA 138  K 3.8  CL 105  CO2 24  GLUCOSE 231*  BUN 10  CREATININE 0.98  CALCIUM 8.4*  AST 38  ALT 30  ALKPHOS 70  BILITOT 0.4   ------------------------------------------------------------------------------------------------------------------  Cardiac Enzymes  Recent Labs Lab 02/27/15 0825  TROPONINI <0.03   ------------------------------------------------------------------------------------------------------------------  RADIOLOGY:  Dg Chest 2 View  02/27/2015   CLINICAL DATA:  Cough.  Pneumonia.  EXAM: CHEST  2 VIEW  COMPARISON:  05/30/2014.  FINDINGS: Mediastinum hilar structures are normal. Prominent left mid lung field pulmonary infiltrate noted consistent with pneumonia. Mild right lung base infiltrate present consistent with pneumonia. Heart size normal. No pleural effusion or pneumothorax . No acute bony abnormality. Prior cervical spine fusion.  IMPRESSION: Multifocal bilateral infiltrates consistent with pneumonia. Pulmonary infiltrate left mid lung field is particularly severe. Underlying mass lesion cannot be excluded. Followup PA and lateral chest X-ray is recommended in 3-4 weeks following trial of antibiotic therapy to ensure resolution and exclude underlying malignancy.   Electronically Signed   By: Maisie Fus  Register   On: 02/27/2015 08:46    ASSESSMENT AND PLAN:   #1. Acute hypoxic respiratory failure secondary to multifocal pneumonia likely community-acquired: Continue oxygen,  Rocephin and Zithromax, if possible sputum cultures.  On room air now. #2 COPD, history of heavy smoking: Not much wheezing at this time. Continues. spiriva , nebulizers. #3.SIRS"present on admission secondary to pneumonia: Continue antibiotics , IV hydration.  #4 depression continue home medications  #5 history of hepatitis C recent diagnosis follows up with [facility] #6.Tobacco abuse counseled against smoking. Offered assistance counseled for about 4 minutes.  All the records are reviewed and case discussed with Care Management/Social Workerr. Management plans discussed with the patient, family and they are in agreement.  CODE STATUS: Full  TOTAL TIME TAKING CARE OF THIS PATIENT: 30 minutes.     POSSIBLE D/C IN 1-2 DAYS, DEPENDING ON CLINICAL CONDITION.   Altamese Dilling M.D on 02/28/2015   Between 7am to 6pm - Pager - [phone]  After 6pm go to [email or web address] - password [email or web address] [facility]  [place] [facility]  Office  [phone]  CC: Primary care physician; No primary care provider on file.  Note: This dictation was prepared with Dragon dictation along with smaller phrase technology. Any transcriptional errors that result from this process are unintentional.

## 2015-02-28 NOTE — Progress Notes (Signed)
Dr. reddy notified of pt low temperature reading. Orders to hold off on scheduled norco (r/t tylenol) and follow up with am rounding physicians.

## 2015-02-28 NOTE — Clinical Social Work Note (Signed)
Clinical Social Work Assessment  Patient Details  Name: Edward Durham MRN: [id number] Date of Birth: [birth year]  Date of referral:  02/28/15               Reason for consult:  Other (Comment Required) (reason for consult not stated)                Permission sought to share information with:    Permission granted to share information::     Name::        Agency::     Relationship::     Contact Information:     Housing/Transportation Living arrangements for the past 2 months:  Single Family Home Source of Information:  Patient Patient Interpreter Needed:  None Criminal Activity/Legal Involvement Pertinent to Current Situation/Hospitalization:  No - Comment as needed Significant Relationships:  None Lives with:    Do you feel safe going back to the place where you live?  Yes Need for family participation in patient care:  No (Coment)  Care giving concerns:  Patient has no caregiving concerns and reports he is independent in all ADL's.   Social Worker assessment / plan:  CSW received consult but from nursing staff but did not specify a reason. CSW spoke with patient this morning and explained why CSW was visiting him. He stated that he is independent with ADL's and is able to get his necessities. He denies any mental health concerns as well as any substance abuse. He states he has no current financial concerns or discharge needs. CSW signing off unless a further need arises.  Employment status:    Insurance information:  Managed Care PT Recommendations:  Not assessed at this time Information / Referral to community resources:     Patient/Family's Response to care:  Patient pleasant and cooperative  Patient/Family's Understanding of and Emotional Response to Diagnosis, Current Treatment, and Prognosis:  Patient appreciative for CSW visit and voices no concerns or needs surrounding when he returns home at this time.  Emotional Assessment Appearance:  Appears stated  age Attitude/Demeanor/Rapport:   (pleasant and cooperative) Affect (typically observed):  Appropriate, Pleasant Orientation:  Oriented to Self, Oriented to Place, Oriented to  Time, Oriented to Situation Alcohol / Substance use:  Not Applicable Psych involvement (Current and /or in the community):  No (Comment)  Discharge Needs  Concerns to be addressed:  No discharge needs identified Readmission within the last 30 days:  No Current discharge risk:  None Barriers to Discharge:  No Barriers Identified   York Spaniel, LCSW 02/28/2015, 11:29 AM

## 2015-03-01 LAB — URINE CULTURE: Culture: NO GROWTH

## 2015-03-01 MED ORDER — CEFUROXIME AXETIL 250 MG PO TABS: 250.0000 mg | ORAL_TABLET | Freq: Two times a day (BID) | ORAL | Status: AC

## 2015-03-01 MED ORDER — PREDNISONE 10 MG (21) PO TBPK: ORAL_TABLET | ORAL | Status: DC

## 2015-03-01 MED ORDER — AZITHROMYCIN 250 MG PO TABS: 250.0000 mg | ORAL_TABLET | Freq: Every day | ORAL | Status: DC

## 2015-03-01 NOTE — Progress Notes (Signed)
Pt VSS;  No complaints of pain nor nausea. Tolerating Diet. Pt received discharge orders. Instructions were reviewed with all questions answered. Prescriptions given to pt.  Pt refused wheelchair. Walked out.

## 2015-03-01 NOTE — Discharge Summary (Signed)
[facility] - [place] at [facility]   PATIENT NAME: Edward Durham    MR#:  [id number]  DATE OF BIRTH:  09-02-1965  DATE OF ADMISSION:  02/27/2015 ADMITTING PHYSICIAN: Katha Hamming, MD  DATE OF DISCHARGE: 03/01/2015  PRIMARY CARE PHYSICIAN: No primary care provider on file.    ADMISSION DIAGNOSIS:  Pneumonia, organism unspecified [J18.9] Sepsis, due to unspecified organism [A41.9]  DISCHARGE DIAGNOSIS:  Active Problems:   Pneumonia   SECONDARY DIAGNOSIS:   Past Medical History  Diagnosis Date  . Pneumonia   . Depression     HOSPITAL COURSE:   #1. Acute hypoxic respiratory failure secondary to multifocal pneumonia likely community-acquired: Continue oxygen, Rocephin and Zithromax,  On room air now.  felt much better- discharge on oral tablets. #2 COPD, history of heavy smoking: Not much wheezing at this time. Continues. spiriva , nebulizers.    switch to oral steroids on d/c #3.SIRS"present on admission secondary to pneumonia: Continue antibiotics , IV hydration.  #4 depression continue home medications  #5 history of hepatitis C recent diagnosis follows up with [facility] #6.Tobacco abuse counseled against smoking. Offered assistance counseled for about 4 minutes.  DISCHARGE CONDITIONS:   Stable.  CONSULTS OBTAINED:     DRUG ALLERGIES:  No Known Allergies  DISCHARGE MEDICATIONS:   Current Discharge Medication List    START taking these medications   Details  azithromycin (ZITHROMAX) 250 MG tablet Take 1 tablet (250 mg total) by mouth daily. Qty: 4 each, Refills: 0    cefUROXime (CEFTIN) 250 MG tablet Take 1 tablet (250 mg total) by mouth 2 (two) times daily with a meal. Qty: 8 tablet, Refills: 0    predniSONE (STERAPRED UNI-PAK 21 TAB) 10 MG (21) TBPK tablet Take 6 tabs first day, 5 tab on day 2, then 4 on day 3rd, 3 tabs on day 4th , 2 tab on day 5th, and 1 tab on 6th day. Qty: 21 tablet, Refills: 0      CONTINUE these  medications which have NOT CHANGED   Details  citalopram (CELEXA) 20 MG tablet Take 20 mg by mouth daily.    clonazePAM (KLONOPIN) 1 MG tablet Take 1 mg by mouth at bedtime as needed for anxiety.     HYDROcodone-acetaminophen (NORCO) 7.5-325 MG per tablet Take 1 tablet by mouth every 8 (eight) hours. Every 8 to 12 hours    HYSINGLA ER 20 MG T24A Take 1 tablet by mouth daily.    ibuprofen (ADVIL,MOTRIN) 400 MG tablet Take 400 mg by mouth every 12 (twelve) hours as needed for mild pain.    PROAIR HFA 108 (90 BASE) MCG/ACT inhaler Inhale 2 puffs into the lungs every 4 (four) hours as needed for wheezing.     QUEtiapine (SEROQUEL) 100 MG tablet Take 100 mg by mouth at bedtime.    SPIRIVA HANDIHALER 18 MCG inhalation capsule Place 1 capsule into inhaler and inhale daily.    traZODone (DESYREL) 50 MG tablet Take 50 mg by mouth at bedtime as needed for sleep.          DISCHARGE INSTRUCTIONS:    Follow with PMD in 1 week.  If you experience worsening of your admission symptoms, develop shortness of breath, life threatening emergency, suicidal or homicidal thoughts you must seek medical attention immediately by calling 911 or calling your MD immediately  if symptoms less severe.  You Must read complete instructions/literature along with all the possible adverse reactions/side effects for all the Medicines you take and that  have been prescribed to you. Take any new Medicines after you have completely understood and accept all the possible adverse reactions/side effects.   Please note  You were cared for by a hospitalist during your hospital stay. If you have any questions about your discharge medications or the care you received while you were in the hospital after you are discharged, you can call the unit and asked to speak with the hospitalist on call if the hospitalist that took care of you is not available. Once you are discharged, your primary care physician will handle any further  medical issues. Please note that NO REFILLS for any discharge medications will be authorized once you are discharged, as it is imperative that you return to your primary care physician (or establish a relationship with a primary care physician if you do not have one) for your aftercare needs so that they can reassess your need for medications and monitor your lab values.    Today   CHIEF COMPLAINT:   Chief Complaint  Patient presents with  . Shortness of Breath    HISTORY OF PRESENT ILLNESS:  [name]  is a 49 y.o. male with a known history of PD, hepatitis C comes in because of shortness of breath, cough and fever for 10 days, Patient having Cough with brown sputum associated with pleuritic chest pain. Having low-grade fever. Chest x-ray showed pneumonia. He was hypoxic with O2 sats 89%, tachycardic with heart rate 1 34 bpm in the ER.   VITAL SIGNS:  Blood pressure 125/70, pulse 91, temperature 97.4 F (36.3 C), temperature source Oral, resp. rate 18, height  (1.778 m), weight 81.239 kg (179 lb 1.6 oz), SpO2 97 %.  I/O:   Intake/Output Summary (Last 24 hours) at 03/01/15 0955 Last data filed at 03/01/15 0538  Gross per 24 hour  Intake    840 ml  Output   1800 ml  Net   -960 ml    PHYSICAL EXAMINATION:   GENERAL: 49 y.o.-year-old patient lying in the bed with no acute distress.  EYES: Pupils equal, round, reactive to light and accommodation. No scleral icterus. Extraocular muscles intact.  HEENT: Head atraumatic, normocephalic. Oropharynx and nasopharynx clear.  NECK: Supple, no jugular venous distention. No thyroid enlargement, no tenderness.  LUNGS: Normal breath sounds bilaterally, no wheezing, mild crepitation. No use of accessory muscles of respiration.  CARDIOVASCULAR: S1, S2 normal. No murmurs, rubs, or gallops.  ABDOMEN: Soft, nontender, nondistended. Bowel sounds present. No organomegaly or mass.  EXTREMITIES: No pedal edema, cyanosis, or  clubbing.  NEUROLOGIC: Cranial nerves II through XII are intact. Muscle strength 5/5 in all extremities. Sensation intact. Gait not checked.  PSYCHIATRIC: The patient is alert and oriented x 3.  SKIN: No obvious rash, lesion, or ulcer.   DATA REVIEW:   CBC  Recent Labs Lab 02/27/15 0825  WBC 17.0*  HGB 10.7*  HCT 32.5*  PLT 299    Chemistries   Recent Labs Lab 02/28/15 0652  NA 138  K 3.8  CL 105  CO2 24  GLUCOSE 231*  BUN 10  CREATININE 0.98  CALCIUM 8.4*  AST 38  ALT 30  ALKPHOS 70  BILITOT 0.4    Cardiac Enzymes  Recent Labs Lab 02/27/15 0825  TROPONINI <0.03    Microbiology Results  Results for orders placed or performed during the hospital encounter of 02/27/15  Blood culture (routine x 2)     Status: None (Preliminary result)   Collection Time: 02/27/15  9:23 AM  Result Value Ref Range Status   Specimen Description BLADDER RIGHT ARM  Final   Special Requests   Final    BOTTLES DRAWN AEROBIC AND ANAEROBIC 5 CC ANAERO 7 CC AERO   Culture NO GROWTH < 24 HOURS  Final   Report Status PENDING  Incomplete  Urine culture     Status: None (Preliminary result)   Collection Time: 02/27/15  9:24 AM  Result Value Ref Range Status   Specimen Description URINE, RANDOM  Final   Special Requests NONE  Final   Culture NO GROWTH < 24 HOURS  Final   Report Status PENDING  Incomplete  Blood culture (routine x 2)     Status: None (Preliminary result)   Collection Time: 02/27/15  9:29 AM  Result Value Ref Range Status   Specimen Description BLOOD LEFT ARM  Final   Special Requests   Final    BOTTLES DRAWN AEROBIC AND ANAEROBIC 1 CC ANAERO 5 CC AERO   Culture NO GROWTH < 24 HOURS  Final   Report Status PENDING  Incomplete    RADIOLOGY:  No results found.   Management plans discussed with the patient, family and they are in agreement.  CODE STATUS:     Code Status Orders        Start     Ordered   02/27/15 1034  Full code   Continuous      02/27/15 1039      TOTAL TIME TAKING CARE OF THIS PATIENT: 35 minutes.    Altamese Dilling M.D on 03/01/2015 at 9:55 AM  Between 7am to 6pm - Pager - [phone]  After 6pm go to [email or web address] - password [email or web address]  [facility] [place] [facility]  Office  [phone]  CC: Primary care physician; No primary care provider on file.   Note: This dictation was prepared with Dragon dictation along with smaller phrase technology. Any transcriptional errors that result from this process are unintentional.

## 2015-03-01 NOTE — Discharge Instructions (Signed)
Pneumonia, Adult  Pneumonia is an infection of the lungs. It may be caused by a germ (virus or bacteria). Some types of pneumonia can spread easily from person to person. This can happen when you cough or sneeze.  HOME CARE   Only take medicine as told by your doctor.   Take your medicine (antibiotics) as told. Finish it even if you start to feel better.   Do not smoke.   You may use a vaporizer or humidifier in your room. This can help loosen thick spit (mucus).   Sleep so you are almost sitting up (semi-upright). This helps reduce coughing.   Rest.  A shot (vaccine) can help prevent pneumonia. Shots are often advised for:   People over 65 years old.   Patients on chemotherapy.   People with long-term (chronic) lung problems.   People with immune system problems.  GET HELP RIGHT AWAY IF:    You are getting worse.   You cannot control your cough, and you are losing sleep.   You cough up blood.   Your pain gets worse, even with medicine.   You have a fever.   Any of your problems are getting worse, not better.   You have shortness of breath or chest pain.  MAKE SURE YOU:    Understand these instructions.   Will watch your condition.   Will get help right away if you are not doing well or get worse.  Document Released: 11/13/2007 Document Revised: 08/19/2011 Document Reviewed: 08/17/2010  ExitCare Patient Information 2015 [employer]. This information is not intended to replace advice given to you by your health care provider. Make sure you discuss any questions you have with your health care provider.

## 2015-03-04 ENCOUNTER — Emergency Department
Admission: EM | Admit: 2015-03-04 | Discharge: 2015-03-04 | Disposition: A | Discharge status: Home / Self Care | Payer: BC Managed Care – PPO | Attending: Emergency Medicine | Admitting: Emergency Medicine

## 2015-03-04 ENCOUNTER — Encounter: Payer: Self-pay | Admitting: Emergency Medicine

## 2015-03-04 ENCOUNTER — Emergency Department: Payer: BC Managed Care – PPO

## 2015-03-04 DIAGNOSIS — Z79899 Other long term (current) drug therapy: Secondary | ICD-10-CM | POA: Insufficient documentation

## 2015-03-04 DIAGNOSIS — F419 Anxiety disorder, unspecified: Secondary | ICD-10-CM | POA: Diagnosis not present

## 2015-03-04 DIAGNOSIS — Z7951 Long term (current) use of inhaled steroids: Secondary | ICD-10-CM | POA: Insufficient documentation

## 2015-03-04 DIAGNOSIS — Z720 Tobacco use: Secondary | ICD-10-CM | POA: Insufficient documentation

## 2015-03-04 DIAGNOSIS — R091 Pleurisy: Secondary | ICD-10-CM | POA: Insufficient documentation

## 2015-03-04 DIAGNOSIS — Z792 Long term (current) use of antibiotics: Secondary | ICD-10-CM | POA: Insufficient documentation

## 2015-03-04 DIAGNOSIS — M549 Dorsalgia, unspecified: Secondary | ICD-10-CM | POA: Diagnosis present

## 2015-03-04 LAB — COMPREHENSIVE METABOLIC PANEL
ALK PHOS: 63 U/L (ref 38–126)
ALT: 30 U/L (ref 17–63)
ANION GAP: 13 (ref 5–15)
AST: 23 U/L (ref 15–41)
Albumin: 3.3 g/dL — ABNORMAL LOW (ref 3.5–5.0)
BILIRUBIN TOTAL: 0.3 mg/dL (ref 0.3–1.2)
BUN: 14 mg/dL (ref 6–20)
CALCIUM: 8.3 mg/dL — AB (ref 8.9–10.3)
CO2: 25 mmol/L (ref 22–32)
CREATININE: 0.98 mg/dL (ref 0.61–1.24)
Chloride: 104 mmol/L (ref 101–111)
GFR calc non Af Amer: >60 mL/min (ref >60)
Glucose, Bld: 106 mg/dL — ABNORMAL HIGH (ref 65–99)
Potassium: 3.5 mmol/L (ref 3.5–5.1)
SODIUM: 142 mmol/L (ref 135–145)
TOTAL PROTEIN: 7.4 g/dL (ref 6.5–8.1)

## 2015-03-04 LAB — CBC WITH DIFFERENTIAL/PLATELET
Basophils Absolute: 0.1 10*3/uL (ref 0–0.1)
Basophils Relative: 1 %
Eosinophils Absolute: 0 10*3/uL (ref 0–0.7)
Eosinophils Relative: 0 %
HEMATOCRIT: 35.6 % — AB (ref 40.0–52.0)
HEMOGLOBIN: 12 g/dL — AB (ref 13.0–18.0)
LYMPHS ABS: 1.7 10*3/uL (ref 1.0–3.6)
LYMPHS PCT: 14 %
MCH: 30.2 pg (ref 26.0–34.0)
MCHC: 33.7 g/dL (ref 32.0–36.0)
MCV: 89.5 fL (ref 80.0–100.0)
MONOS PCT: 4 %
Monocytes Absolute: 0.4 10*3/uL (ref 0.2–1.0)
NEUTROS ABS: 9.6 10*3/uL — AB (ref 1.4–6.5)
NEUTROS PCT: 81 %
Platelets: 587 10*3/uL — ABNORMAL HIGH (ref 150–440)
RBC: 3.98 MIL/uL — AB (ref 4.40–5.90)
RDW: 15.7 % — ABNORMAL HIGH (ref 11.5–14.5)
WBC: 11.8 10*3/uL — AB (ref 3.8–10.6)

## 2015-03-04 MED ORDER — KETOROLAC TROMETHAMINE 30 MG/ML IJ SOLN
30.0000 mg | Freq: Once | INTRAMUSCULAR | Status: AC
  Administered 2015-03-04: 30 mg via INTRAVENOUS
  Filled 2015-03-04: qty 1

## 2015-03-04 NOTE — ED Notes (Signed)
States has had L shoulderblade pain x 3 weeks, states during that time he was diagnosed and treated for pneumonia so is concerned his pneumonia is returning, states he has had this pain during the entire past 3 weeks

## 2015-03-04 NOTE — Discharge Instructions (Signed)

## 2015-03-05 NOTE — ED Provider Notes (Signed)
[facility] Emergency Department Provider Note  ____________________________________________  Time seen: On arrival  I have reviewed the triage vital signs and the nursing notes.   HISTORY  Chief Complaint Back pain   HPI Edward Durham is a 49 y.o. male who was recently admitted for significant pneumonia. He received IV antibiotics and was discharged with by mouth antibiotics. He reports he has pleurisy and continues to have pain in his back which makes him concerned that his pneumonia is worsening. He denies fevers. He reports his breathing is improving overall and denies shortness of breath. No nausea no vomiting. He reports compliance with his medications. He does continue to smoke     Past Medical History  Diagnosis Date  . Pneumonia   . Depression     Patient Active Problem List   Diagnosis Date Noted  . Pneumonia 02/27/2015    Past Surgical History  Procedure Laterality Date  . Cervical spine surgery      Current Outpatient Rx  Name  Route  Sig  Dispense  Refill  . azithromycin (ZITHROMAX) 250 MG tablet   Oral   Take 1 tablet (250 mg total) by mouth daily.   4 each   0   . cefUROXime (CEFTIN) 250 MG tablet   Oral   Take 1 tablet (250 mg total) by mouth 2 (two) times daily with a meal.   8 tablet   0   . citalopram (CELEXA) 20 MG tablet   Oral   Take 20 mg by mouth daily.         . clonazePAM (KLONOPIN) 1 MG tablet   Oral   Take 1 mg by mouth at bedtime as needed for anxiety.          Marland Kitchen HYDROcodone-acetaminophen (NORCO) 7.5-325 MG per tablet   Oral   Take 1 tablet by mouth every 8 (eight) hours. Every 8 to 12 hours         . HYSINGLA ER 20 MG T24A   Oral   Take 1 tablet by mouth daily.           Dispense as written.   Marland Kitchen ibuprofen (ADVIL,MOTRIN) 400 MG tablet   Oral   Take 400 mg by mouth every 12 (twelve) hours as needed for mild pain.         . predniSONE (STERAPRED UNI-PAK 21 TAB) 10 MG (21)  TBPK tablet      Take 6 tabs first day, 5 tab on day 2, then 4 on day 3rd, 3 tabs on day 4th , 2 tab on day 5th, and 1 tab on 6th day.   21 tablet   0   . PROAIR HFA 108 (90 BASE) MCG/ACT inhaler   Inhalation   Inhale 2 puffs into the lungs every 4 (four) hours as needed for wheezing.            Dispense as written.   Marland Kitchen QUEtiapine (SEROQUEL) 100 MG tablet   Oral   Take 100 mg by mouth at bedtime.         Marland Kitchen SPIRIVA HANDIHALER 18 MCG inhalation capsule   Inhalation   Place 1 capsule into inhaler and inhale daily.           Dispense as written.   . traZODone (DESYREL) 50 MG tablet   Oral   Take 50 mg by mouth at bedtime as needed for sleep.            Allergies  Review of patient's allergies indicates no known allergies.  No family history on file.  Social History Social History  Substance Use Topics  . Smoking status: Current Every Day Smoker -- 0.50 packs/day    Types: Cigarettes  . Smokeless tobacco: None  . Alcohol Use: Yes    Review of Systems  Constitutional: Negative for fever. Eyes: Negative for visual changes. ENT: Negative for sore throat Cardiovascular: Negative for chest pain. Respiratory: Negative for shortness of breath. Positive for pleurisy Gastrointestinal: Negative for abdominal pain, vomiting and diarrhea. Genitourinary: Negative for dysuria. Musculoskeletal: Negative for back pain. Skin: Negative for rash. Neurological: Negative for headaches or focal weakness Psychiatric: Mild anxiety    ____________________________________________   PHYSICAL EXAM:  VITAL SIGNS: ED Triage Vitals  Enc Vitals Group     BP 03/04/15 1757 114/79 mmHg     Pulse Rate 03/04/15 1757 98     Resp 03/04/15 1757 20     Temp 03/04/15 1757 98 F (36.7 C)     Temp Source 03/04/15 1757 Oral     SpO2 03/04/15 1757 94 %     Weight 03/04/15 1757 185 lb (83.915 kg)     Height 03/04/15 1757  (1.702 m)     Head Cir --      Peak Flow --      Pain  Score 03/04/15 1758 10     Pain Loc --      Pain Edu? --      Excl. in GC? --      Constitutional: Alert and oriented. Well appearing and in no distress. Eyes: Conjunctivae are normal.  ENT   Head: Normocephalic and atraumatic.   Mouth/Throat: Mucous membranes are moist. Cardiovascular: Normal rate, regular rhythm. Normal and symmetric distal pulses are present in all extremities. No murmurs, rubs, or gallops. Respiratory: Normal respiratory effort without tachypnea nor retractions. Breath sounds are clear and equal bilaterally.  Gastrointestinal: Soft and non-tender in all quadrants. No distention. There is no CVA tenderness. Genitourinary: deferred Musculoskeletal: Nontender with normal range of motion in all extremities. No lower extremity tenderness nor edema. Neurologic:  Normal speech and language. No gross focal neurologic deficits are appreciated. Skin:  Skin is warm, dry and intact. No rash noted. Psychiatric: Mood and affect are normal. Patient exhibits appropriate insight and judgment.  ____________________________________________    LABS (pertinent positives/negatives)  Labs Reviewed  CBC WITH DIFFERENTIAL/PLATELET - Abnormal; Notable for the following:    WBC 11.8 (*)    RBC 3.98 (*)    Hemoglobin 12.0 (*)    HCT 35.6 (*)    RDW 15.7 (*)    Platelets 587 (*)    Neutro Abs 9.6 (*)    All other components within normal limits  COMPREHENSIVE METABOLIC PANEL - Abnormal; Notable for the following:    Glucose, Bld 106 (*)    Calcium 8.3 (*)    Albumin 3.3 (*)    All other components within normal limits    ____________________________________________   EKG  None  ____________________________________________    RADIOLOGY I have personally reviewed any xrays that were ordered on this patient: Chest x-ray shows improvement of pneumonia  ____________________________________________   PROCEDURES  Procedure(s) performed: none  Critical Care  performed: none  ____________________________________________   INITIAL IMPRESSION / ASSESSMENT AND PLAN / ED COURSE  Pertinent labs & imaging results that were available during my care of the patient were reviewed by me and considered in my medical decision making (see chart for details).  Patient without tachycardia, no fever, no tachypnea and satting well. His labs are unremarkable and his chest x-ray shows improvement. I suspect is pleurisy is related to his resolving pneumonia. I discussed with him strict return precautions and the need to continue his antibiotics as prescribed  ____________________________________________   FINAL CLINICAL IMPRESSION(S) / ED DIAGNOSES  Final diagnoses:  Pleurisy     Jene Every, MD 03/05/15 0005

## 2015-03-06 LAB — CULTURE, BLOOD (ROUTINE X 2)
Culture: NO GROWTH
Culture: NO GROWTH

## 2015-07-01 ENCOUNTER — Emergency Department: Payer: Medicare Other

## 2015-07-01 ENCOUNTER — Encounter: Payer: Self-pay | Admitting: Emergency Medicine

## 2015-07-01 ENCOUNTER — Emergency Department
Admission: EM | Admit: 2015-07-01 | Discharge: 2015-07-01 | Disposition: A | Discharge status: Home / Self Care | Payer: Medicare Other | Attending: Emergency Medicine | Admitting: Emergency Medicine

## 2015-07-01 DIAGNOSIS — Z79899 Other long term (current) drug therapy: Secondary | ICD-10-CM | POA: Insufficient documentation

## 2015-07-01 DIAGNOSIS — Z79891 Long term (current) use of opiate analgesic: Secondary | ICD-10-CM | POA: Insufficient documentation

## 2015-07-01 DIAGNOSIS — R29898 Other symptoms and signs involving the musculoskeletal system: Secondary | ICD-10-CM

## 2015-07-01 DIAGNOSIS — R55 Syncope and collapse: Secondary | ICD-10-CM | POA: Diagnosis not present

## 2015-07-01 DIAGNOSIS — F17210 Nicotine dependence, cigarettes, uncomplicated: Secondary | ICD-10-CM | POA: Diagnosis not present

## 2015-07-01 DIAGNOSIS — R05 Cough: Secondary | ICD-10-CM | POA: Diagnosis not present

## 2015-07-01 DIAGNOSIS — R531 Weakness: Secondary | ICD-10-CM | POA: Insufficient documentation

## 2015-07-01 DIAGNOSIS — R252 Cramp and spasm: Secondary | ICD-10-CM | POA: Diagnosis not present

## 2015-07-01 LAB — BASIC METABOLIC PANEL
ANION GAP: 9 (ref 5–15)
BUN: 15 mg/dL (ref 6–20)
CALCIUM: 9 mg/dL (ref 8.9–10.3)
CHLORIDE: 102 mmol/L (ref 101–111)
CO2: 27 mmol/L (ref 22–32)
Creatinine, Ser: 1.43 mg/dL — ABNORMAL HIGH (ref 0.61–1.24)
GFR calc non Af Amer: 56 mL/min — ABNORMAL LOW (ref >60)
Glucose, Bld: 161 mg/dL — ABNORMAL HIGH (ref 65–99)
POTASSIUM: 4.1 mmol/L (ref 3.5–5.1)
Sodium: 138 mmol/L (ref 135–145)

## 2015-07-01 LAB — CBC WITH DIFFERENTIAL/PLATELET
BASOS ABS: 0.1 10*3/uL (ref 0–0.1)
BASOS PCT: 1 %
Eosinophils Absolute: 0.1 10*3/uL (ref 0–0.7)
Eosinophils Relative: 1 %
HEMATOCRIT: 33.7 % — AB (ref 40.0–52.0)
HEMOGLOBIN: 11 g/dL — AB (ref 13.0–18.0)
LYMPHS PCT: 12 %
Lymphs Abs: 1.4 10*3/uL (ref 1.0–3.6)
MCH: 28.1 pg (ref 26.0–34.0)
MCHC: 32.7 g/dL (ref 32.0–36.0)
MCV: 85.9 fL (ref 80.0–100.0)
Monocytes Absolute: 0.9 10*3/uL (ref 0.2–1.0)
Monocytes Relative: 8 %
NEUTROS ABS: 9.2 10*3/uL — AB (ref 1.4–6.5)
NEUTROS PCT: 78 %
Platelets: 343 10*3/uL (ref 150–440)
RBC: 3.92 MIL/uL — AB (ref 4.40–5.90)
RDW: 15.6 % — AB (ref 11.5–14.5)
WBC: 11.7 10*3/uL — ABNORMAL HIGH (ref 3.8–10.6)

## 2015-07-01 LAB — ETHANOL

## 2015-07-01 LAB — CK: CK TOTAL: 117 U/L (ref 49–397)

## 2015-07-01 LAB — TROPONIN I: Troponin I: <0.03 ng/mL (ref <0.031)

## 2015-07-01 MED ORDER — IOHEXOL 350 MG/ML SOLN
100.0000 mL | Freq: Once | INTRAVENOUS | Status: AC | PRN
  Administered 2015-07-01: 100 mL via INTRAVENOUS

## 2015-07-01 MED ORDER — ALBUTEROL SULFATE (2.5 MG/3ML) 0.083% IN NEBU
2.5000 mg | INHALATION_SOLUTION | Freq: Once | RESPIRATORY_TRACT | Status: AC
  Administered 2015-07-01: 2.5 mg via RESPIRATORY_TRACT
  Filled 2015-07-01: qty 3

## 2015-07-01 MED ORDER — AZITHROMYCIN 250 MG PO TABS
500.0000 mg | ORAL_TABLET | Freq: Once | ORAL | Status: AC
  Administered 2015-07-01: 500 mg via ORAL
  Filled 2015-07-01: qty 2

## 2015-07-01 MED ORDER — AZITHROMYCIN 250 MG PO TABS: ORAL_TABLET | ORAL | Status: AC

## 2015-07-01 MED ORDER — CARISOPRODOL 350 MG PO TABS: 350.0000 mg | ORAL_TABLET | Freq: Three times a day (TID) | ORAL | Status: AC | PRN

## 2015-07-01 MED ORDER — KETOROLAC TROMETHAMINE 30 MG/ML IJ SOLN
30.0000 mg | Freq: Once | INTRAMUSCULAR | Status: AC
  Administered 2015-07-01: 30 mg via INTRAVENOUS
  Filled 2015-07-01: qty 1

## 2015-07-01 MED ORDER — DIAZEPAM 5 MG/ML IJ SOLN
2.5000 mg | Freq: Once | INTRAMUSCULAR | Status: AC
  Administered 2015-07-01: 2.5 mg via INTRAVENOUS
  Filled 2015-07-01: qty 2

## 2015-07-01 MED ORDER — OXYCODONE-ACETAMINOPHEN 5-325 MG PO TABS: 1.0000 | ORAL_TABLET | Freq: Once | ORAL | Status: DC

## 2015-07-01 MED ORDER — SODIUM CHLORIDE 0.9 % IV BOLUS (SEPSIS)
1000.0000 mL | Freq: Once | INTRAVENOUS | Status: AC
Start: 2015-07-01 — End: 2015-07-01
  Administered 2015-07-01: 1000 mL via INTRAVENOUS

## 2015-07-01 NOTE — ED Notes (Signed)
Cough x 2 weeks, today feels generally weak, nodding at triage.

## 2015-07-01 NOTE — ED Provider Notes (Signed)
[facility] Emergency Department Provider Note  ____________________________________________  Time seen: Approximately 230 PM  I have reviewed the triage vital signs and the nursing notes.   HISTORY  Chief Complaint Weakness    HPI Edward Durham [facility] is a 50 y.o. male history of depression and pneumonia who is presenting today with a cough and weakness to his bilateral lower extremities. He says that over the past week he has had a cough and had a course of Tessalon Perles without any improvement in his symptoms. He had a fever about a week ago but has not had a fever since. He has not taken any antibiotics. He is denying any shortness of breath or chest pain. He said that he did have a pneumonia about one year ago that he needed to be trached and sent to rehabilitation for. He said that while chopping wood this morning he felt cramping and weakness in his bilateral lower extremities.  He said that he fell to the ground and upon standing felt lightheaded and then passed out. He said that he also is has been drinking this morning. Smokes about a pack to a pack and half a day of cigarettes.   Past Medical History  Diagnosis Date  . Pneumonia   . Depression     Patient Active Problem List   Diagnosis Date Noted  . Pneumonia 02/27/2015    Past Surgical History  Procedure Laterality Date  . Cervical spine surgery      Current Outpatient Rx  Name  Route  Sig  Dispense  Refill  . azithromycin (ZITHROMAX) 250 MG tablet   Oral   Take 1 tablet (250 mg total) by mouth daily.   4 each   0   . cefUROXime (CEFTIN) 250 MG tablet   Oral   Take 1 tablet (250 mg total) by mouth 2 (two) times daily with a meal.   8 tablet   0   . citalopram (CELEXA) 20 MG tablet   Oral   Take 20 mg by mouth daily.         . clonazePAM (KLONOPIN) 1 MG tablet   Oral   Take 1 mg by mouth at bedtime as needed for anxiety.          Marland Kitchen HYDROcodone-acetaminophen (NORCO)  7.5-325 MG per tablet   Oral   Take 1 tablet by mouth every 8 (eight) hours. Every 8 to 12 hours         . HYSINGLA ER 20 MG T24A   Oral   Take 1 tablet by mouth daily.           Dispense as written.   Marland Kitchen ibuprofen (ADVIL,MOTRIN) 400 MG tablet   Oral   Take 400 mg by mouth every 12 (twelve) hours as needed for mild pain.         . predniSONE (STERAPRED UNI-PAK 21 TAB) 10 MG (21) TBPK tablet      Take 6 tabs first day, 5 tab on day 2, then 4 on day 3rd, 3 tabs on day 4th , 2 tab on day 5th, and 1 tab on 6th day.   21 tablet   0   . PROAIR HFA 108 (90 BASE) MCG/ACT inhaler   Inhalation   Inhale 2 puffs into the lungs every 4 (four) hours as needed for wheezing.            Dispense as written.   Marland Kitchen QUEtiapine (SEROQUEL) 100 MG tablet  Oral   Take 100 mg by mouth at bedtime.         Marland Kitchen SPIRIVA HANDIHALER 18 MCG inhalation capsule   Inhalation   Place 1 capsule into inhaler and inhale daily.           Dispense as written.   . traZODone (DESYREL) 50 MG tablet   Oral   Take 50 mg by mouth at bedtime as needed for sleep.            Allergies Review of patient's allergies indicates no known allergies.  No family history on file.  Social History Social History  Substance Use Topics  . Smoking status: Current Every Day Smoker -- 0.50 packs/day    Types: Cigarettes  . Smokeless tobacco: None  . Alcohol Use: Yes    Review of Systems Constitutional: No fever/chills Eyes: No visual changes. ENT: No sore throat. Cardiovascular: Denies chest pain. Respiratory: Denies shortness of breath. Gastrointestinal: No abdominal pain.  No nausea, no vomiting.  No diarrhea.  No constipation. Genitourinary: Negative for dysuria. Musculoskeletal: Negative for back pain. Skin: Negative for rash. Neurological: Negative for headaches, focal weakness or numbness.  10-point ROS otherwise negative.  ____________________________________________   PHYSICAL EXAM:  VITAL  SIGNS: ED Triage Vitals  Enc Vitals Group     BP 07/01/15 1407 144/100 mmHg     Pulse Rate 07/01/15 1407 95     Resp 07/01/15 1407 18     Temp 07/01/15 1407 97.6 F (36.4 C)     Temp Source 07/01/15 1407 Oral     SpO2 07/01/15 1407 92 %     Weight 07/01/15 1407 189 lb (85.73 kg)     Height 07/01/15 1407  (1.702 m)     Head Cir --      Peak Flow --      Pain Score 07/01/15 1408 10     Pain Loc --      Pain Edu? --      Excl. in GC? --     Constitutional: Alert and oriented. Well appearing and in no acute distress. Eyes: Conjunctivae are normal. PERRL. EOMI. Head: Atraumatic. Nose: No congestion/rhinnorhea. Mouth/Throat: Mucous membranes are moist.  Oropharynx non-erythematous. Neck: No stridor.   Cardiovascular: Normal rate, regular rhythm. Grossly normal heart sounds.  Good peripheral circulation. Respiratory: Normal respiratory effort.  No retractions. Lungs CTAB. Gastrointestinal: Soft and nontender. No distention. No abdominal bruits. No CVA tenderness. Musculoskeletal: No lower extremity tenderness nor edema.  No joint effusions. No saddle anesthesia. Neurologic:  Normal speech and language. No gross focal neurologic deficits are appreciated.   5 out of 5 strength throughout. No sensory deficits. Skin:  Skin is warm, dry and intact. No rash noted. Psychiatric: Mood and affect are normal. Speech and behavior are normal.  ____________________________________________   LABS (all labs ordered are listed, but only abnormal results are displayed)  Labs Reviewed  CBC WITH DIFFERENTIAL/PLATELET - Abnormal; Notable for the following:    WBC 11.7 (*)    RBC 3.92 (*)    Hemoglobin 11.0 (*)    HCT 33.7 (*)    RDW 15.6 (*)    Neutro Abs 9.2 (*)    All other components within normal limits  BASIC METABOLIC PANEL - Abnormal; Notable for the following:    Glucose, Bld 161 (*)    Creatinine, Ser 1.43 (*)    GFR calc non Af Amer 56 (*)    All other components within normal  limits  ETHANOL  TROPONIN I  CK   ____________________________________________  EKG  ED ECG REPORT I, Arelia Longest, the attending physician, personally viewed and interpreted this ECG.   Date: 07/01/2015  EKG Time: 1437  Rate: 92  Rhythm: normal sinus rhythm  Axis: Normal axis  Intervals:none  ST&T Change: No ST segment elevation or depression. T-wave inversion in aVL.  No change from EKG done on 04/25/2015.  ____________________________________________  RADIOLOGY  CT angiography without any vascular abnormality. Circumferential esophageal wall thickening as can be seen and esophagitis. Bibasilar reticular nodular interstitial disease. May be secondary to infectious or inflammatory etiology. Chest x-ray with resolution of pneumonia. CT head without any acute abdomen out a period ____________________________________________   PROCEDURES   ____________________________________________   INITIAL IMPRESSION / ASSESSMENT AND PLAN / ED COURSE  Pertinent labs & imaging results that were available during my care of the patient were reviewed by me and considered in my medical decision making (see chart for details).  ----------------------------------------- 7:25 PM on 07/01/2015 -----------------------------------------  At this point the patient says that his pain has resolved. However, he is still ambulating with an abnormal gait. I retested his strength to the bilateral lower extremities and he has a 5 out of 5 strength to the bilateral lower extremities. However when he ambulates he does have a shuffling gait. I asked him further questions such as if his neck pain is any worsening or one he denies. I reexamined his upper extremities and he has no issues with coordination or strength. He has no ataxia to his upper extremities and is 5 out of 5 strength bilaterally there. He is nontender to cervical spine. He continues to have no saddle anesthesia. I discussed with them  that I'm concerned due to his gait that he may have a condition such as Guillain-Barr or spinal compression. However, at this time he is wishing to be discharged to home and would like to get some rest and something to eat. I told him that if he has any worsening of his symptoms that he needs to return immediately. I did discuss with them consequences of delaying diagnosis in this instance of his paralysis. The patient understands this plan, is clinically sober and will like to try some muscle relaxers at home and will come back, he says, if any of his symptoms are worsening. His girlfriend was also at the bedside while I explained this to the patient. I also reviewed the labs and imaging results with the patient as well as his girlfriend. ____________________________________________   FINAL CLINICAL IMPRESSION(S) / ED DIAGNOSES  Final diagnoses:  Leg weakness   lower extremity muscle cramps. Syncope.   Myrna Blazer, MD 07/01/15 [phone]

## 2015-07-01 NOTE — ED Notes (Signed)
Patient transported to CT 

## 2016-07-03 DIAGNOSIS — Z6829 Body mass index (BMI) 29.0-29.9, adult: Secondary | ICD-10-CM | POA: Diagnosis not present

## 2016-07-03 DIAGNOSIS — F19980 Other psychoactive substance use, unspecified with psychoactive substance-induced anxiety disorder: Secondary | ICD-10-CM | POA: Diagnosis not present

## 2016-07-03 DIAGNOSIS — R590 Localized enlarged lymph nodes: Secondary | ICD-10-CM | POA: Diagnosis not present

## 2016-07-06 ENCOUNTER — Emergency Department
Admission: EM | Admit: 2016-07-06 | Discharge: 2016-07-06 | Disposition: A | Discharge status: Home / Self Care | Payer: Medicare HMO | Attending: Emergency Medicine | Admitting: Emergency Medicine

## 2016-07-06 ENCOUNTER — Encounter: Payer: Self-pay | Admitting: *Deleted

## 2016-07-06 ENCOUNTER — Emergency Department: Payer: Medicare HMO

## 2016-07-06 DIAGNOSIS — M79601 Pain in right arm: Secondary | ICD-10-CM | POA: Insufficient documentation

## 2016-07-06 DIAGNOSIS — F17210 Nicotine dependence, cigarettes, uncomplicated: Secondary | ICD-10-CM | POA: Diagnosis not present

## 2016-07-06 DIAGNOSIS — Z5181 Encounter for therapeutic drug level monitoring: Secondary | ICD-10-CM | POA: Insufficient documentation

## 2016-07-06 DIAGNOSIS — M79621 Pain in right upper arm: Secondary | ICD-10-CM | POA: Insufficient documentation

## 2016-07-06 LAB — COMPREHENSIVE METABOLIC PANEL
ALBUMIN: 3.6 g/dL (ref 3.5–5.0)
ALK PHOS: 74 U/L (ref 38–126)
ALT: 24 U/L (ref 17–63)
AST: 30 U/L (ref 15–41)
Anion gap: 4 — ABNORMAL LOW (ref 5–15)
BUN: 13 mg/dL (ref 6–20)
CALCIUM: 8.2 mg/dL — AB (ref 8.9–10.3)
CO2: 27 mmol/L (ref 22–32)
Chloride: 103 mmol/L (ref 101–111)
Creatinine, Ser: 0.87 mg/dL (ref 0.61–1.24)
GFR calc Af Amer: >60 mL/min (ref >60)
GLUCOSE: 102 mg/dL — AB (ref 65–99)
Potassium: 3.9 mmol/L (ref 3.5–5.1)
Sodium: 134 mmol/L — ABNORMAL LOW (ref 135–145)
TOTAL PROTEIN: 7.2 g/dL (ref 6.5–8.1)
Total Bilirubin: 0.5 mg/dL (ref 0.3–1.2)

## 2016-07-06 LAB — CBC WITH DIFFERENTIAL/PLATELET
BASOS PCT: 1 %
Basophils Absolute: 0.1 10*3/uL (ref 0–0.1)
Eosinophils Absolute: 0.4 10*3/uL (ref 0–0.7)
Eosinophils Relative: 6 %
HEMATOCRIT: 32.1 % — AB (ref 40.0–52.0)
HEMOGLOBIN: 10.6 g/dL — AB (ref 13.0–18.0)
LYMPHS PCT: 41 %
Lymphs Abs: 2.7 10*3/uL (ref 1.0–3.6)
MCH: 28.3 pg (ref 26.0–34.0)
MCHC: 32.9 g/dL (ref 32.0–36.0)
MCV: 86 fL (ref 80.0–100.0)
MONO ABS: 0.6 10*3/uL (ref 0.2–1.0)
MONOS PCT: 9 %
NEUTROS ABS: 2.9 10*3/uL (ref 1.4–6.5)
NEUTROS PCT: 43 %
Platelets: 283 10*3/uL (ref 150–440)
RBC: 3.74 MIL/uL — ABNORMAL LOW (ref 4.40–5.90)
RDW: 16.5 % — ABNORMAL HIGH (ref 11.5–14.5)
WBC: 6.6 10*3/uL (ref 3.8–10.6)

## 2016-07-06 LAB — PROTIME-INR
INR: 0.97
PROTHROMBIN TIME: 12.9 s (ref 11.4–15.2)

## 2016-07-06 MED ORDER — MELOXICAM 15 MG PO TABS: 15.0000 mg | ORAL_TABLET | Freq: Every day | ORAL | 0 refills | Status: AC

## 2016-07-06 NOTE — ED Notes (Signed)
Patient transported to Ultrasound 

## 2016-07-06 NOTE — ED Triage Notes (Signed)
Pt reports having a aching dull pain in right arm for the past two weeks. Pt reports PCP reported it to be lymph node pains but pain has not improved. Pt denies swelling or injury to the area. Pt is able to move arm without difficulty but reports movement increases the pain. Pulses intact and color appropriate of right arm .

## 2016-07-06 NOTE — ED Provider Notes (Signed)
[facility] Emergency Department Provider Note  ____________________________________________  Time seen: Approximately 6:28 PM  I have reviewed the triage vital signs and the nursing notes.   HISTORY  Chief Complaint Arm Pain    HPI Edward Durham [facility] is a 51 y.o. male who presents emergency department complaining of right medial upper arm pain. Patient states that he has had this pain for approximately one month. 2 weeks into this course, he was seen by primary care and told it was "lymph node." Patient states that he has had no recent illnesses, fevers or chills, night sweats. Patient reports that the pain stretches from right medial upper arm to the elbow, all medial aspect. Patient denies any swelling or bruising to the area. He denies any swelling to the arm. Patient reports that he has full range of motion to the right arm including the elbow, shoulder, wrist. He denies any numbness or tingling in any of his digits. No recent injury.   Past Medical History:  Diagnosis Date  . Depression   . Pneumonia     Patient Active Problem List   Diagnosis Date Noted  . Pneumonia 02/27/2015    Past Surgical History:  Procedure Laterality Date  . CERVICAL SPINE SURGERY      Prior to Admission medications   Medication Sig Start Date End Date Taking? Authorizing Provider  carisoprodol (SOMA) 350 MG tablet Take 1 tablet (350 mg total) by mouth 3 (three) times daily as needed for muscle spasms. 07/01/15   Myrna Blazer, MD  cefUROXime (CEFTIN) 250 MG tablet Take 1 tablet (250 mg total) by mouth 2 (two) times daily with a meal. 03/01/15   Altamese Dilling, MD  citalopram (CELEXA) 20 MG tablet Take 20 mg by mouth daily.    Historical Provider, MD  clonazePAM (KLONOPIN) 1 MG tablet Take 1 mg by mouth at bedtime as needed for anxiety.     Historical Provider, MD  HYDROcodone-acetaminophen (NORCO) 7.5-325 MG per tablet Take 1 tablet by mouth every 8  (eight) hours. Every 8 to 12 hours    Historical Provider, MD  [facility] ER 20 MG T24A Take 1 tablet by mouth daily.    Historical Provider, MD  ibuprofen (ADVIL,MOTRIN) 400 MG tablet Take 400 mg by mouth every 12 (twelve) hours as needed for mild pain.    Historical Provider, MD  meloxicam (MOBIC) 15 MG tablet Take 1 tablet (15 mg total) by mouth daily. 07/06/16   Delorise Royals Tu Bayle, PA-C  predniSONE (STERAPRED UNI-PAK 21 TAB) 10 MG (21) TBPK tablet Take 6 tabs first day, 5 tab on day 2, then 4 on day 3rd, 3 tabs on day 4th , 2 tab on day 5th, and 1 tab on 6th day. 03/01/15   Altamese Dilling, MD  PROAIR HFA 108 (90 BASE) MCG/ACT inhaler Inhale 2 puffs into the lungs every 4 (four) hours as needed for wheezing.     Historical Provider, MD  QUEtiapine (SEROQUEL) 100 MG tablet Take 100 mg by mouth at bedtime.    Historical Provider, MD  SPIRIVA HANDIHALER 18 MCG inhalation capsule Place 1 capsule into inhaler and inhale daily.    Historical Provider, MD  traZODone (DESYREL) 50 MG tablet Take 50 mg by mouth at bedtime as needed for sleep.     Historical Provider, MD    Allergies Patient has no known allergies.  History reviewed. No pertinent family history.  Social History Social History  Substance Use Topics  . Smoking status: Current  Every Day Smoker    Packs/day: 1.00    Types: Cigarettes  . Smokeless tobacco: Never Used  . Alcohol use Yes     Review of Systems  Constitutional: No fever/chills Cardiovascular: no chest pain. Respiratory: no cough. No SOB. Gastrointestinal: No abdominal pain.  No nausea, no vomiting.  No diarrhea.  No constipation. Musculoskeletal: Positive for right upper arm pain Skin: Negative for rash, abrasions, lacerations, ecchymosis. Neurological: Negative for headaches, focal weakness or numbness. 10-point ROS otherwise negative.  ____________________________________________   PHYSICAL EXAM:  VITAL SIGNS: ED Triage Vitals  Enc Vitals Group      BP 07/06/16 1646 133/85     Pulse Rate 07/06/16 1646 74     Resp 07/06/16 1646 16     Temp 07/06/16 1646 98 F (36.7 C)     Temp Source 07/06/16 1646 Oral     SpO2 07/06/16 1646 98 %     Weight 07/06/16 1647 188 lb (85.3 kg)     Height 07/06/16 1647 5\' 7"  (1.702 m)     Head Circumference --      Peak Flow --      Pain Score 07/06/16 1647 10     Pain Loc --      Pain Edu? --      Excl. in GC? --      Constitutional: Alert and oriented. Well appearing and in no acute distress. Eyes: Conjunctivae are normal. PERRL. EOMI. Head: Atraumatic. ENT:      Ears:       Nose: No congestion/rhinnorhea.      Mouth/Throat: Mucous membranes are moist.  Neck: No stridor.  No cervical spine tenderness to palpation. Hematological/Lymphatic/Immunilogical: No cervical lymphadenopathy. No palpable axillary lymphadenopathy. Cardiovascular: Normal rate, regular rhythm. Normal S1 and S2.  Good peripheral circulation. Respiratory: Normal respiratory effort without tachypnea or retractions. Lungs CTAB. Good air entry to the bases with no decreased or absent breath sounds. Musculoskeletal: Full range of motion to all extremities. No gross deformities appreciated. No gross deformities or edema noted to the right upper extremity. Full range of motion to right shoulder, elbow, wrist. Examination of the elbow and shoulder are unremarkable. No visible ecchymosis to medial arm. Palpation reveals no palpable abnormality. Palpation does not increase pain. Radial pulse intact distally. Sensation intact 4 digits. Patient has a surgically absent right thumb. Neurologic:  Normal speech and language. No gross focal neurologic deficits are appreciated.  Skin:  Skin is warm, dry and intact. No rash noted. Psychiatric: Mood and affect are normal. Speech and behavior are normal. Patient exhibits appropriate insight and judgement.   ____________________________________________   LABS (all labs ordered are listed, but only  abnormal results are displayed)  Labs Reviewed  CBC WITH DIFFERENTIAL/PLATELET - Abnormal; Notable for the following:       Result Value   RBC 3.74 (*)    Hemoglobin 10.6 (*)    HCT 32.1 (*)    RDW 16.5 (*)    All other components within normal limits  COMPREHENSIVE METABOLIC PANEL - Abnormal; Notable for the following:    Sodium 134 (*)    Glucose, Bld 102 (*)    Calcium 8.2 (*)    Anion gap 4 (*)    All other components within normal limits  PROTIME-INR   ____________________________________________  EKG   ____________________________________________  RADIOLOGY Festus BarrenI, Honestie Kulik D Tierre Netto, personally viewed and evaluated these images (plain radiographs) as part of my medical decision making, as well as reviewing the written report by  the radiologist.  US Venous Img Upper Uni Right  Result Date: 07/06/2016 CLINICAL DATA:  Right arm pain 1 month. EXAM: RIGHT UPPER EXTREMITY VENOUS DOPPLER ULTRASOUND TECHNIQUE: Gray-scale sonography with graded compression, as well as color Doppler and duplex ultrasound were performed to evaluate the upper extremity deep venous system from the level of the subclavian vein and including the jugular, axillary, basilic, radial, ulnar and upper cephalic vein. Spectral Doppler was utilized to evaluate flow at rest and with distal augmentation maneuvers. COMPARISON:  None. FINDINGS: Contralateral Subclavian Vein: Respiratory phasicity is normal and symmetric with the symptomatic side. No evidence of thrombus. Normal compressibility. Internal Jugular Vein: No evidence of thrombus. Normal compressibility, respiratory phasicity and response to augmentation. Subclavian Vein: No evidence of thrombus. Normal compressibility, respiratory phasicity and response to augmentation. Axillary Vein: No evidence of thrombus. Normal compressibility, respiratory phasicity and response to augmentation. Cephalic Vein: No evidence of thrombus. Normal compressibility, respiratory  phasicity and response to augmentation. Basilic Vein: No evidence of thrombus. Normal compressibility, respiratory phasicity and response to augmentation. Brachial Veins: No evidence of thrombus. Normal compressibility, respiratory phasicity and response to augmentation. Radial Veins: No evidence of thrombus. Normal compressibility, respiratory phasicity and response to augmentation. Ulnar Veins: No evidence of thrombus. Normal compressibility, respiratory phasicity and response to augmentation. Venous Reflux:  None visualized. Other Findings:  None visualized. IMPRESSION: No evidence of deep venous thrombosis. Electronically Signed   By: Elberta Fortis M.D.   On: 07/06/2016 21:08   Dg Humerus Right  Result Date: 07/06/2016 CLINICAL DATA:  Patient with humerus pain. No known injury. Initial encounter. EXAM: RIGHT HUMERUS - 2+ VIEW COMPARISON:  None. FINDINGS: There is no evidence of fracture or other focal bone lesions. Soft tissues are unremarkable. IMPRESSION: Negative. Electronically Signed   By: Annia Belt M.D.   On: 07/06/2016 18:56    ____________________________________________    PROCEDURES  Procedure(s) performed:    Procedures    Medications - No data to display   ____________________________________________   INITIAL IMPRESSION / ASSESSMENT AND PLAN / ED COURSE  Pertinent labs & imaging results that were available during my care of the patient were reviewed by me and considered in my medical decision making (see chart for details).  Review of the [place] CSRS was performed in accordance of the [facility] prior to dispensing any controlled drugs.     Patient's diagnosis is consistent with right upper arm pain. Patient presented to the emergency department complaining of a one-month history of medial right upper arm pain. He was evaluated once by primary care and told it was "enlarged lymph nodes." Patient was not having pain to the axillary region. Exam reveals no palpable  abnormality. No palpable lymphadenopathy. Patient had full range of motion to the shoulder and elbow. X-ray was performed to evaluate for possible underlying fracture versus osseous tumor. This returns with no acute findings. Ultrasound was obtained to investigate possible basilar thrombosis. This returns without any occlusion. No other concerning symptoms at this time. Labs returned with reassuring results with no significant findings to explain patient's condition.. Patient will be trialed on anti-inflammatories to see if this improved symptoms. He will follow up with orthopedics for further evaluation of arm complaint. Patient is given ED precautions to return to the ED for any worsening or new symptoms.     ____________________________________________  FINAL CLINICAL IMPRESSION(S) / ED DIAGNOSES  Final diagnoses:  Arm pain, medial, right      NEW MEDICATIONS STARTED DURING THIS VISIT:  New Prescriptions  MELOXICAM (MOBIC) 15 MG TABLET    Take 1 tablet (15 mg total) by mouth daily.        This chart was dictated using voice recognition software/Dragon. Despite best efforts to proofread, errors can occur which can change the meaning. Any change was purely unintentional.    Racheal Patches, PA-C 07/06/16 2138    Jennye Moccasin, MD 07/06/16 [phone]

## 2016-07-06 NOTE — ED Notes (Signed)
US notified patient has access, she said it would be about a half an hour

## 2016-09-19 IMAGING — CR DG CHEST 1V PORT
1 series · 2 of 2 positions shown · non-contrast
Comparison: 04/24/2014 and CT chest 02/21/2014.

CLINICAL DATA: Post intubation, agonal breathing.

EXAM:
PORTABLE CHEST - 1 VIEW

[Series 1: ap · 0.17mm/px · 2 of 2 slices shown]
[im 1/2]
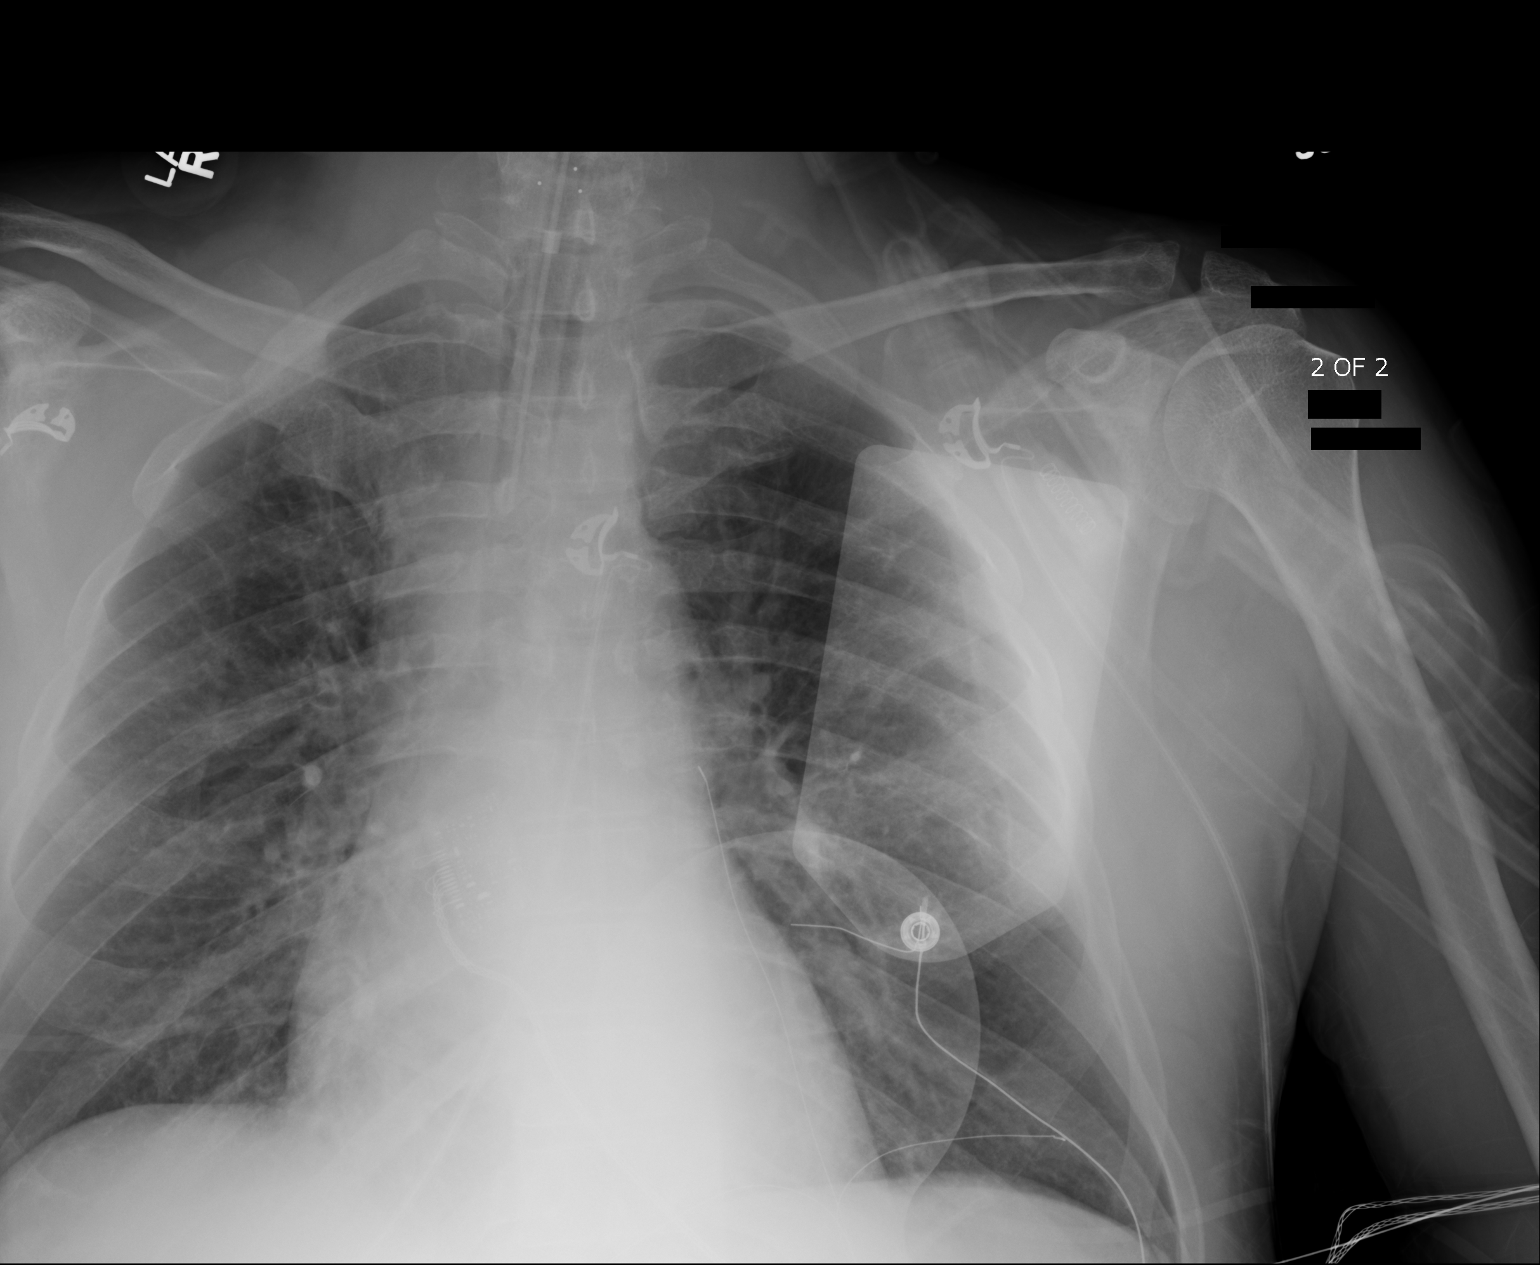
[im 2/2]
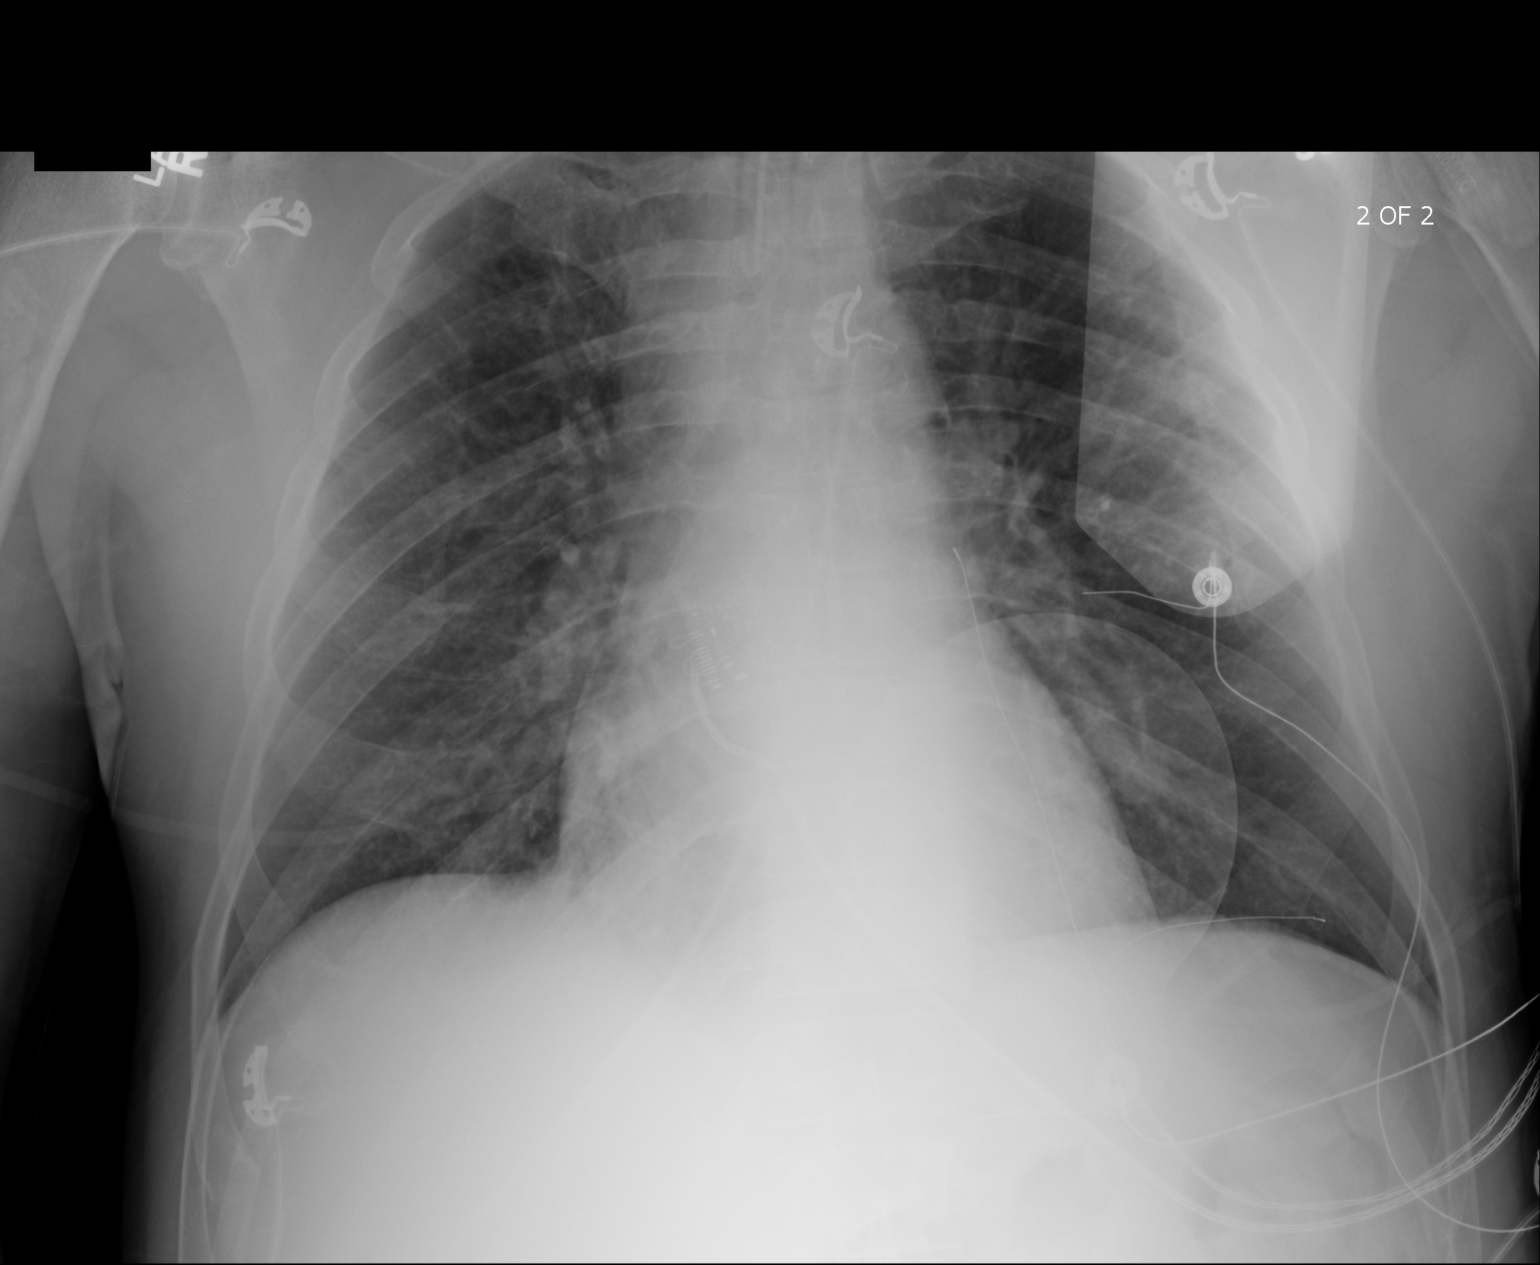

[2 of 2 positions shown; findings below may reference images not displayed]

FINDINGS: Endotracheal tube terminates approximately 4.9 cm above the carina.
Defibrillator pads overlie the left chest. Heart size normal. Lungs
are clear. No pleural fluid. No pneumothorax.
IMPRESSION: 1. Endotracheal tube is in satisfactory position.
2. No acute findings in the chest.

## 2016-09-19 IMAGING — CR DG ABDOMEN 1V
1 series · 1 of 1 positions shown · non-contrast
Comparison: 02/29/2012

CLINICAL DATA: Orogastric tube placement initial evaluation

EXAM:
ABDOMEN - 1 VIEW

[ap]
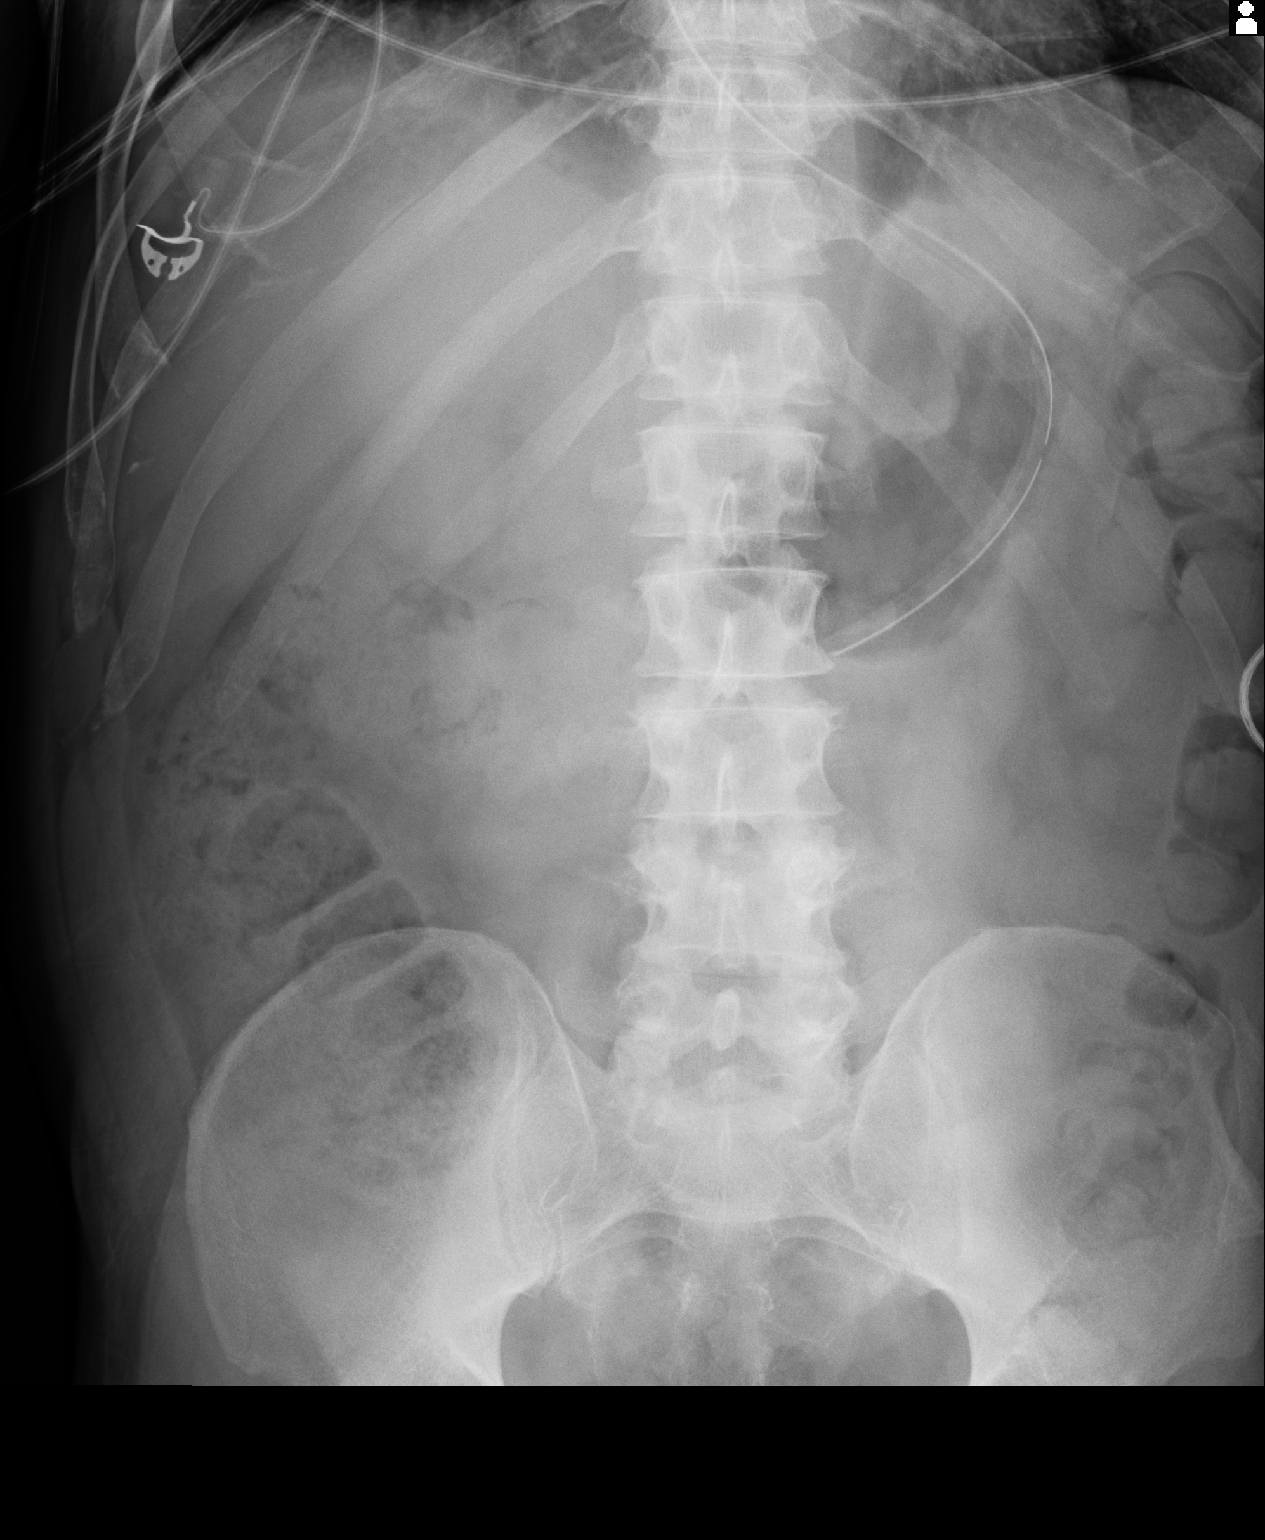

[1 of 1 positions shown; findings below may reference images not displayed]

FINDINGS: Orogastric tube extends along the greater curvature of the stomach
with tip in the distal body of the stomach. There is stool
throughout the colon.
IMPRESSION: Orogastric feeding tube in the stomach

## 2016-09-20 IMAGING — CT CT ABD-PELV W/O CM
2 of 4 series · 16 of 46 positions shown, 18 images · non-contrast
Comparison: None

CLINICAL DATA: Unresponsive 1 day after signing out from hospital
AMA, previous admission for sepsis, pneumonia, and intubation ;
sepsis, abdominal distension question intra-abdominal pathology

EXAM:
CT ABDOMEN AND PELVIS WITHOUT CONTRAST
TECHNIQUE: Multidetector CT imaging of the abdomen and pelvis was performed
following the standard protocol without IV contrast. Sagittal and
coronal MPR images reconstructed from axial data set. Oral contrast
not administered.

[Series 2: routine abd pel without · axial · non-contrast · 0.75mm/px · z∈[-1256,-786]mm · 13 of 104 slices shown, 15 images]
[im 5/104  soft-tissue]
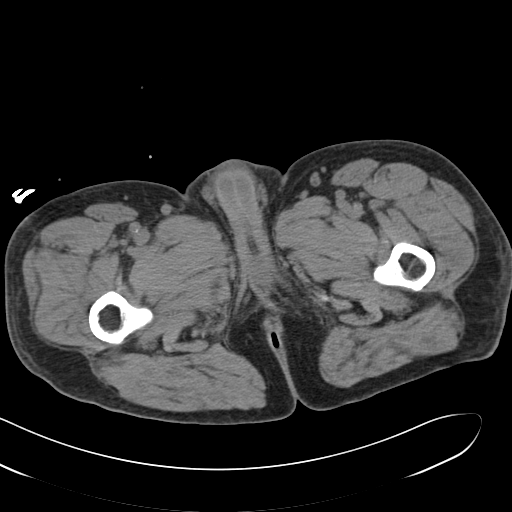
[im 5/104  bone]
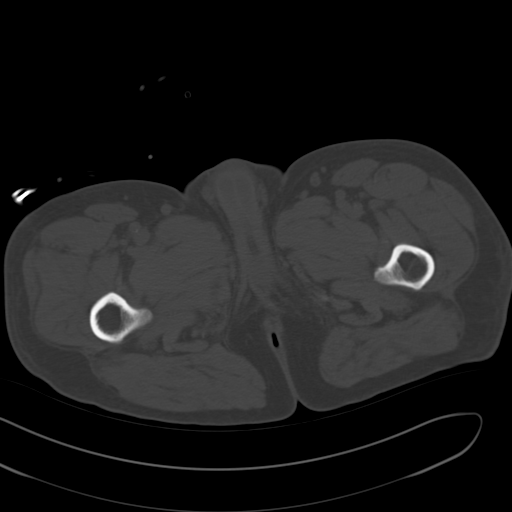
[im 13/104  soft-tissue]
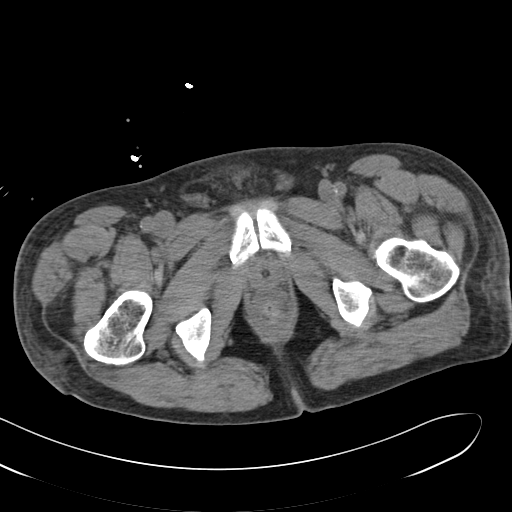
[im 21/104  soft-tissue]
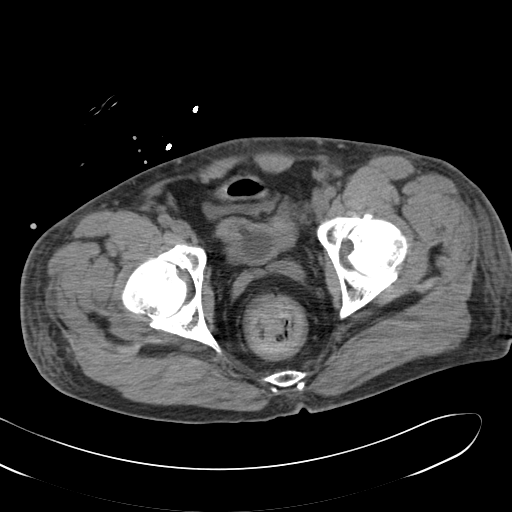
[im 29/104  soft-tissue]
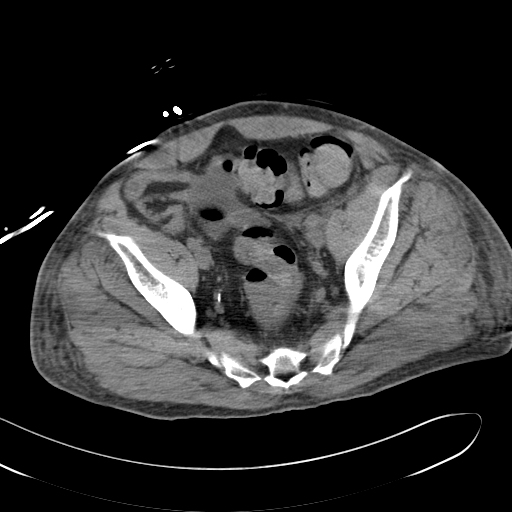
[im 38/104  soft-tissue]
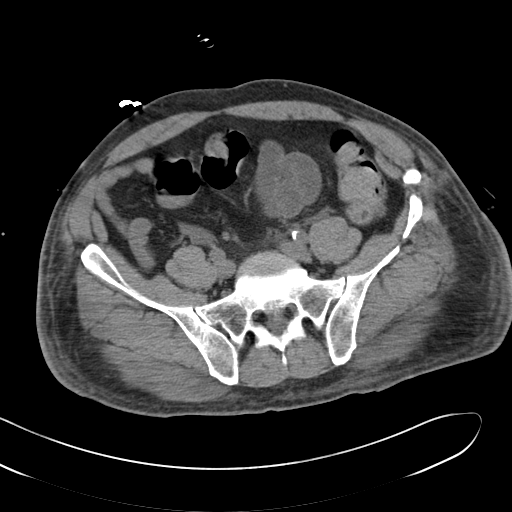
[im 46/104  soft-tissue]
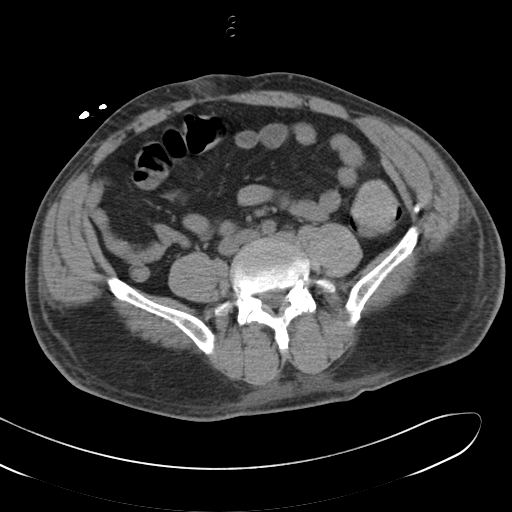
[im 54/104  soft-tissue]
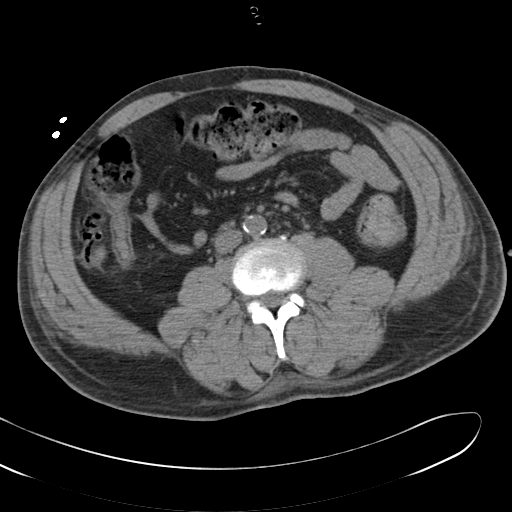
[im 58/104  soft-tissue]
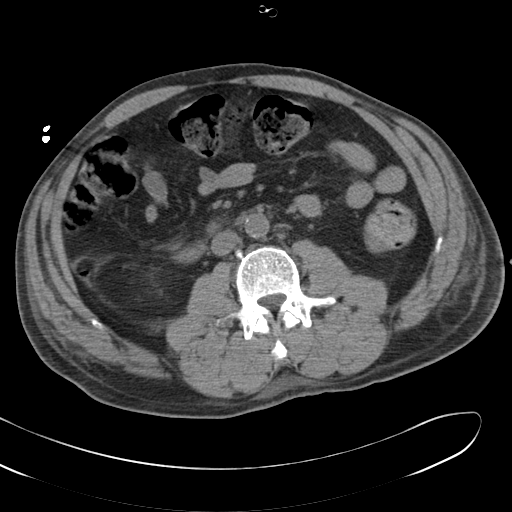
[im 66/104  soft-tissue]
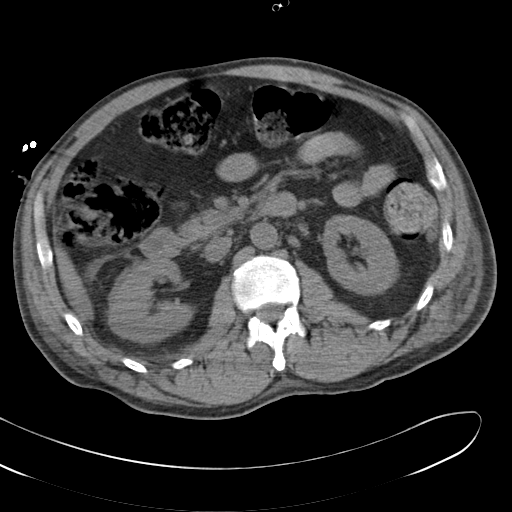
[im 66/104  bone]
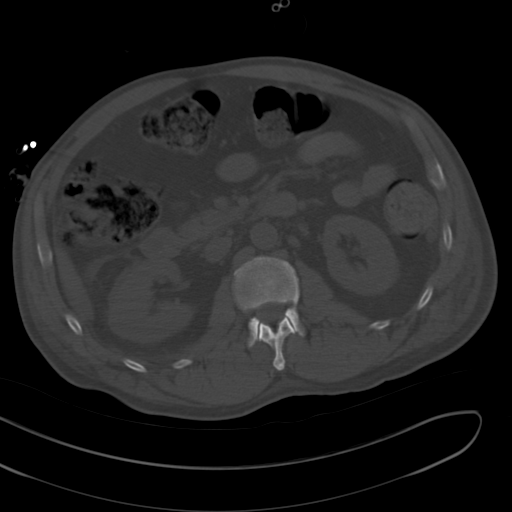
[im 75/104  soft-tissue]
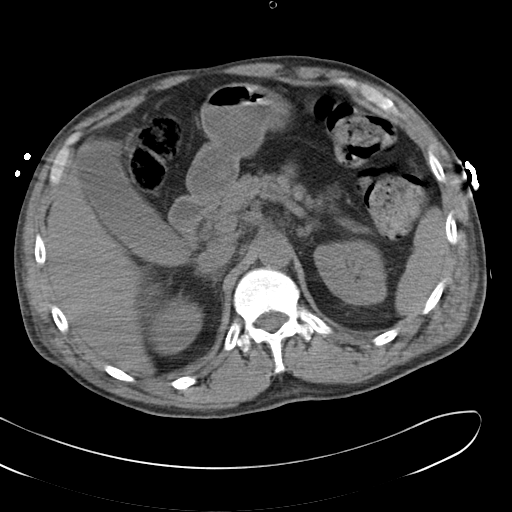
[im 83/104  soft-tissue]
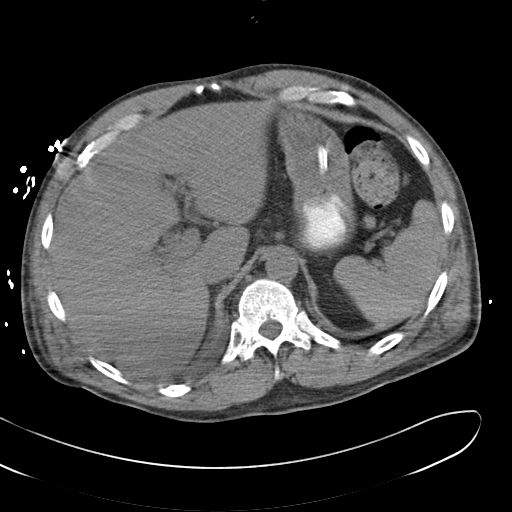
[im 91/104  soft-tissue]
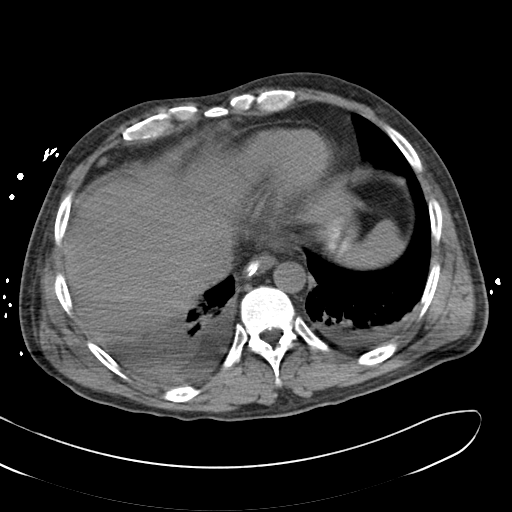
[im 99/104  soft-tissue]
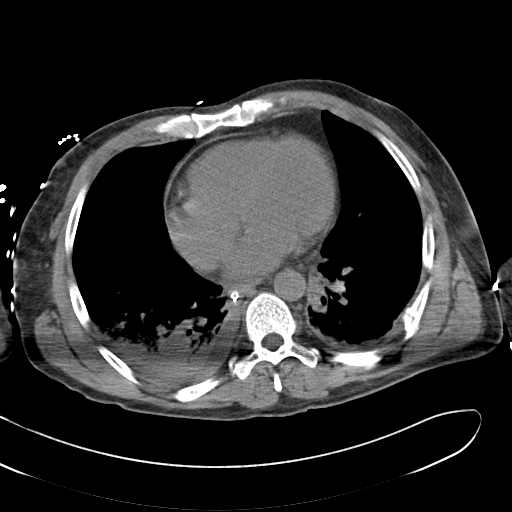

[Series 5: cor routine abd pel wo · coronal · 0.77mm/px · 3 of 133 slices shown]
[im 45/133  soft-tissue]
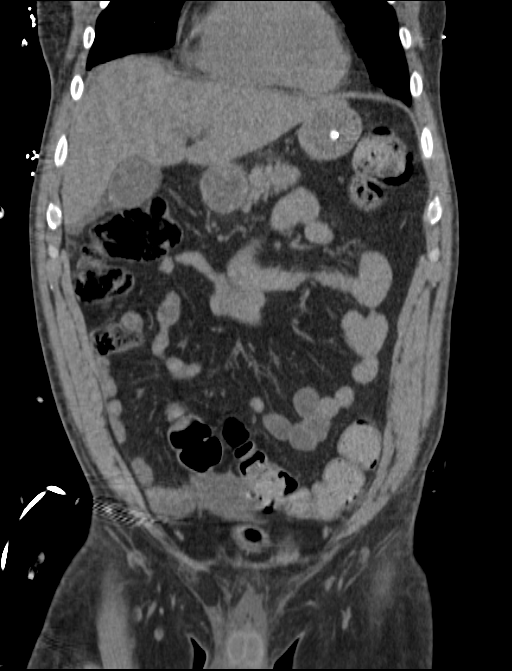
[im 59/133  soft-tissue]
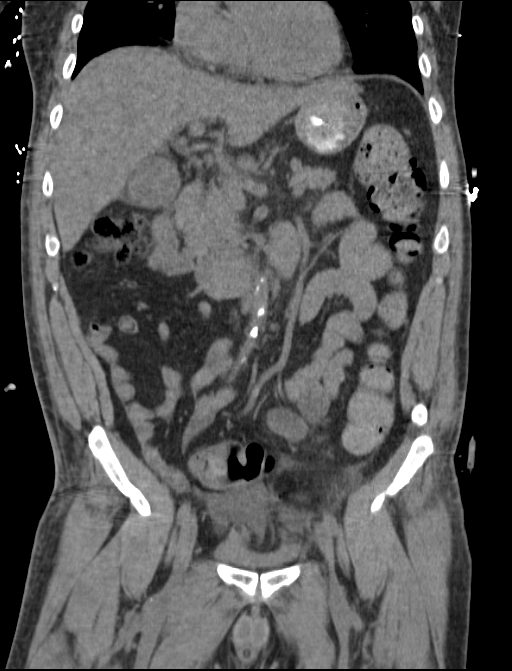
[im 74/133  soft-tissue]
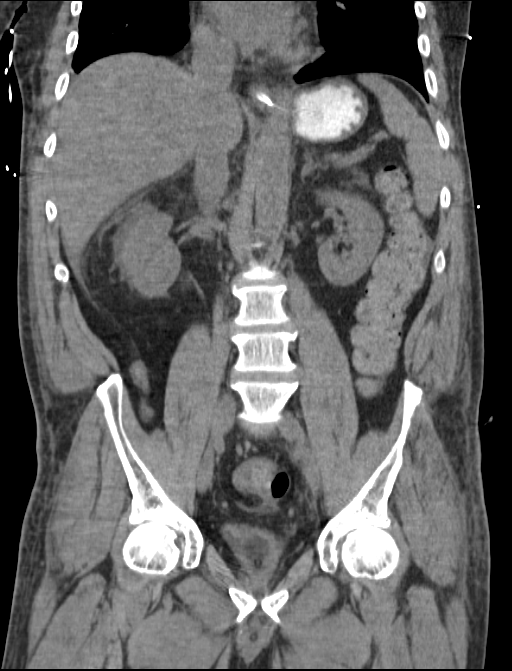

[16 of 46 positions shown; findings below may reference images not displayed]

FINDINGS: Small bibasilar effusions and atelectasis greater on RIGHT.

Within limits of noncontrast technique, no focal abnormalities of
the liver, spleen, pancreas, kidneys, or adrenal glands.

Normal appendix.

Free fluid in pelvis.

Scattered atherosclerotic calcifications without aneurysm.

Nasogastric tube tip in mid stomach.

Rectal wall thickening consistent with proctitis.

Mildly prominent stool in colon.

Remaining large and small bowel loops grossly unremarkable for
technique.

No mass, adenopathy, or free air.

Small umbilical hernia containing fat.

Bladder decompressed by Foley catheter.

Retroperitoneal fluid identified in LEFT pelvis of uncertain
etiology, with adjacent sigmoid colon showing no evidence of
diverticula, wall thickening or pericolic inflammation.

No abnormalities of the LEFT perinephric or anterior/posterior
pararenal spaces to account for pelvic retroperitoneal fluid.

Iliopsoas muscles grossly symmetric without evidence of abscess.

No acute osseous findings.
IMPRESSION: Rectal wall thickening consistent with proctitis, recommend
correlation with proctoscopy.

Nonspecific free pelvic fluid.

Retroperitoneal fluid/edema in LEFT pelvis of uncertain etiology,
without evidence of adjacent colonic inflammation.

This could be related to retroperitoneal inflammatory process or
infection, bladder perforation, or old retroperitoneal hemorrhage;
no definite LEFT urinary tract abnormalities to account for this
finding.

## 2016-09-24 ENCOUNTER — Encounter (HOSPITAL_COMMUNITY): Payer: Self-pay | Admitting: Emergency Medicine

## 2016-09-24 DIAGNOSIS — F1110 Opioid abuse, uncomplicated: Secondary | ICD-10-CM | POA: Diagnosis not present

## 2016-09-24 DIAGNOSIS — F17210 Nicotine dependence, cigarettes, uncomplicated: Secondary | ICD-10-CM | POA: Diagnosis not present

## 2016-09-24 DIAGNOSIS — Z79899 Other long term (current) drug therapy: Secondary | ICD-10-CM | POA: Diagnosis not present

## 2016-09-24 DIAGNOSIS — Z8673 Personal history of transient ischemic attack (TIA), and cerebral infarction without residual deficits: Secondary | ICD-10-CM | POA: Diagnosis not present

## 2016-09-24 NOTE — ED Triage Notes (Signed)
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

## 2016-09-25 ENCOUNTER — Emergency Department (HOSPITAL_COMMUNITY)
Admission: EM | Admit: 2016-09-25 | Discharge: 2016-09-25 | Disposition: A | Discharge status: Home / Self Care | Payer: Medicare HMO | Attending: Emergency Medicine | Admitting: Emergency Medicine

## 2016-09-25 ENCOUNTER — Ambulatory Visit (HOSPITAL_COMMUNITY): Admission: RE | Admit: 2016-09-25 | Payer: Medicare HMO | Source: Home / Self Care | Admitting: Psychiatry

## 2016-09-25 ENCOUNTER — Encounter: Payer: Self-pay | Admitting: *Deleted

## 2016-09-25 ENCOUNTER — Emergency Department
Admission: EM | Admit: 2016-09-25 | Discharge: 2016-09-25 | Disposition: A | Discharge status: Home / Self Care | Payer: Medicare HMO | Attending: Emergency Medicine | Admitting: Emergency Medicine

## 2016-09-25 DIAGNOSIS — F1110 Opioid abuse, uncomplicated: Secondary | ICD-10-CM

## 2016-09-25 DIAGNOSIS — F1119 Opioid abuse with unspecified opioid-induced disorder: Secondary | ICD-10-CM | POA: Insufficient documentation

## 2016-09-25 DIAGNOSIS — F17210 Nicotine dependence, cigarettes, uncomplicated: Secondary | ICD-10-CM | POA: Insufficient documentation

## 2016-09-25 DIAGNOSIS — Z791 Long term (current) use of non-steroidal anti-inflammatories (NSAID): Secondary | ICD-10-CM | POA: Diagnosis not present

## 2016-09-25 DIAGNOSIS — Z79899 Other long term (current) drug therapy: Secondary | ICD-10-CM | POA: Diagnosis not present

## 2016-09-25 DIAGNOSIS — F1910 Other psychoactive substance abuse, uncomplicated: Secondary | ICD-10-CM

## 2016-09-25 HISTORY — DX: Cerebral infarction, unspecified: I63.9

## 2016-09-25 HISTORY — DX: Opioid dependence, uncomplicated: F11.20

## 2016-09-25 LAB — COMPREHENSIVE METABOLIC PANEL
ALT: 44 U/L (ref 17–63)
AST: 53 U/L — AB (ref 15–41)
Albumin: 3.6 g/dL (ref 3.5–5.0)
Alkaline Phosphatase: 87 U/L (ref 38–126)
Anion gap: 9 (ref 5–15)
BUN: 10 mg/dL (ref 6–20)
CO2: 29 mmol/L (ref 22–32)
CREATININE: 1.02 mg/dL (ref 0.61–1.24)
Calcium: 8.4 mg/dL — ABNORMAL LOW (ref 8.9–10.3)
Chloride: 99 mmol/L — ABNORMAL LOW (ref 101–111)
GFR calc Af Amer: >60 mL/min (ref >60)
GFR calc non Af Amer: >60 mL/min (ref >60)
GLUCOSE: 108 mg/dL — AB (ref 65–99)
POTASSIUM: 3.6 mmol/L (ref 3.5–5.1)
Sodium: 137 mmol/L (ref 135–145)
Total Bilirubin: 0.5 mg/dL (ref 0.3–1.2)
Total Protein: 7.3 g/dL (ref 6.5–8.1)

## 2016-09-25 LAB — CBC WITH DIFFERENTIAL/PLATELET
Basophils Absolute: 0 10*3/uL (ref 0.0–0.1)
Basophils Relative: 1 %
EOS PCT: 4 %
Eosinophils Absolute: 0.3 10*3/uL (ref 0.0–0.7)
HEMATOCRIT: 34.6 % — AB (ref 39.0–52.0)
Hemoglobin: 10.7 g/dL — ABNORMAL LOW (ref 13.0–17.0)
LYMPHS ABS: 2.2 10*3/uL (ref 0.7–4.0)
LYMPHS PCT: 34 %
MCH: 27 pg (ref 26.0–34.0)
MCHC: 30.9 g/dL (ref 30.0–36.0)
MCV: 87.4 fL (ref 78.0–100.0)
MONO ABS: 0.5 10*3/uL (ref 0.1–1.0)
MONOS PCT: 8 %
Neutro Abs: 3.5 10*3/uL (ref 1.7–7.7)
Neutrophils Relative %: 53 %
PLATELETS: 235 10*3/uL (ref 150–400)
RBC: 3.96 MIL/uL — ABNORMAL LOW (ref 4.22–5.81)
RDW: 15 % (ref 11.5–15.5)
WBC: 6.6 10*3/uL (ref 4.0–10.5)

## 2016-09-25 LAB — RAPID URINE DRUG SCREEN, HOSP PERFORMED
Amphetamines: NOT DETECTED
BARBITURATES: NOT DETECTED
BENZODIAZEPINES: POSITIVE — AB
COCAINE: NOT DETECTED
OPIATES: POSITIVE — AB
Tetrahydrocannabinol: POSITIVE — AB

## 2016-09-25 LAB — ETHANOL: ALCOHOL ETHYL (B): 38 mg/dL — AB (ref <5)

## 2016-09-25 IMAGING — CR DG CHEST 1V PORT
1 series · 1 of 1 positions shown · non-contrast
Comparison: Portable exam 0625 hr compared to 04/29/2014

CLINICAL DATA: On ventilator, sedation

EXAM:
PORTABLE CHEST - 1 VIEW

[ap]
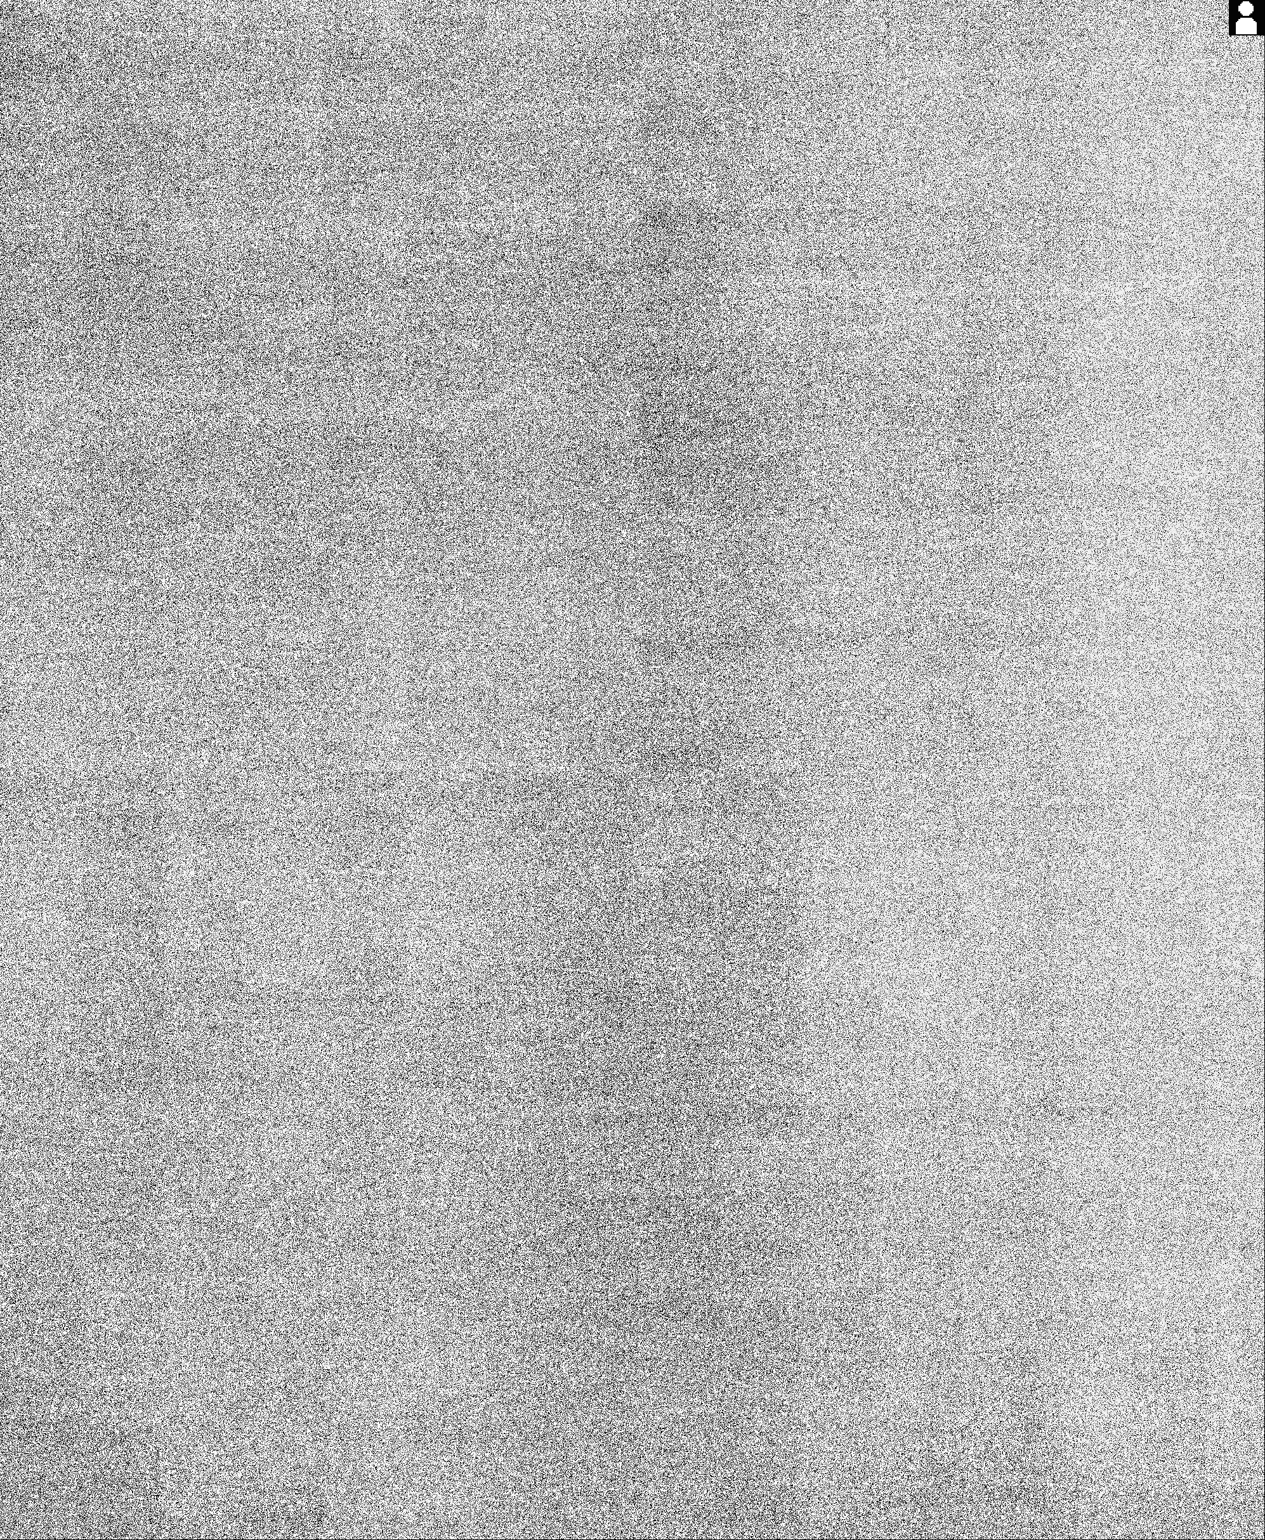

[1 of 1 positions shown; findings below may reference images not displayed]

FINDINGS: Tip of endotracheal tube projects 2.9 cm above carina.

Nasogastric tube extends into stomach.

Tip of RIGHT arm PICC line projects over superior RIGHT atrium,
could be withdrawn 3 cm to place at approximately the cavoatrial
junction.

Normal heart size, mediastinal contours, and pulmonary vascularity.

Decreased RIGHT lung opacity since previous exam likely representing
combination of improved infiltrate and decreased effusion.

Central cubic changes.

Remaining lungs grossly clear.

No pneumothorax.
IMPRESSION: Improved aeration RIGHT lung likely representing decreased
infiltrate and pleural effusion.

Tip of RIGHT arm PICC line projects over upper RIGHT atrium, could
be withdrawn 3 cm to place tip at approximately the cavoatrial
junction.

## 2016-09-25 NOTE — ED Notes (Signed)
Pt provided with MD instructions to go directly to [facility] today. Patient left at this time with all belongings.

## 2016-09-25 NOTE — ED Provider Notes (Signed)
[facility] Emergency Department Provider Note  Time seen: 6:39 PM  I have reviewed the triage vital signs and the nursing notes.   HISTORY  Chief Complaint Drug Problem    HPI Edward Durham [facility] is a 51 y.o. male with a past medical history of depression, heroin addiction, presents to the emergency department hoping to detox from heroin. Patient states for the past 6 months he has been using heroin again after being clean for 2 years previously. Patient went to [facility] yesterday, was sent to a methadone clinic but cannot be seen until tomorrow. Patient came here hoping for some help such as methadone or Suboxone. Patient denies SI or HI. Denies any medical complaints.  Past Medical History:  Diagnosis Date  . Depression   . Heroin addiction (HCC)   . Pneumonia   . Stroke [facility])     Patient Active Problem List   Diagnosis Date Noted  . Pneumonia 02/27/2015    Past Surgical History:  Procedure Laterality Date  . CERVICAL SPINE SURGERY      Prior to Admission medications   Medication Sig Start Date End Date Taking? Authorizing Provider  carisoprodol (SOMA) 350 MG tablet Take 1 tablet (350 mg total) by mouth 3 (three) times daily as needed for muscle spasms. 07/01/15   Myrna Blazer, MD  cefUROXime (CEFTIN) 250 MG tablet Take 1 tablet (250 mg total) by mouth 2 (two) times daily with a meal. 03/01/15   Altamese Dilling, MD  citalopram (CELEXA) 20 MG tablet Take 20 mg by mouth daily.    Historical Provider, MD  clonazePAM (KLONOPIN) 1 MG tablet Take 1 mg by mouth at bedtime as needed for anxiety.     Historical Provider, MD  HYDROcodone-acetaminophen (NORCO) 7.5-325 MG per tablet Take 1 tablet by mouth every 8 (eight) hours. Every 8 to 12 hours    Historical Provider, MD  [facility] ER 20 MG T24A Take 1 tablet by mouth daily.    Historical Provider, MD  ibuprofen (ADVIL,MOTRIN) 400 MG tablet Take 400 mg by mouth every 12 (twelve)  hours as needed for mild pain.    Historical Provider, MD  meloxicam (MOBIC) 15 MG tablet Take 1 tablet (15 mg total) by mouth daily. 07/06/16   Delorise Royals Cuthriell, PA-C  predniSONE (STERAPRED UNI-PAK 21 TAB) 10 MG (21) TBPK tablet Take 6 tabs first day, 5 tab on day 2, then 4 on day 3rd, 3 tabs on day 4th , 2 tab on day 5th, and 1 tab on 6th day. 03/01/15   Altamese Dilling, MD  PROAIR HFA 108 (90 BASE) MCG/ACT inhaler Inhale 2 puffs into the lungs every 4 (four) hours as needed for wheezing.     Historical Provider, MD  QUEtiapine (SEROQUEL) 100 MG tablet Take 100 mg by mouth at bedtime.    Historical Provider, MD  SPIRIVA HANDIHALER 18 MCG inhalation capsule Place 1 capsule into inhaler and inhale daily.    Historical Provider, MD  traZODone (DESYREL) 50 MG tablet Take 50 mg by mouth at bedtime as needed for sleep.     Historical Provider, MD    No Known Allergies  History reviewed. No pertinent family history.  Social History Social History  Substance Use Topics  . Smoking status: Current Every Day Smoker    Packs/day: 1.00    Types: Cigarettes  . Smokeless tobacco: Never Used  . Alcohol use Yes     ____________________________________________   PHYSICAL EXAM:  VITAL SIGNS: ED  Triage Vitals  Enc Vitals Group     BP 09/25/16 1724 123/82     Pulse Rate 09/25/16 1724 74     Resp 09/25/16 1724 18     Temp 09/25/16 1724 98.4 F (36.9 C)     Temp Source 09/25/16 1724 Oral     SpO2 09/25/16 1724 97 %     Weight 09/25/16 1723 177 lb (80.3 kg)     Height 09/25/16 1723  (1.702 m)     Head Circumference --      Peak Flow --      Pain Score --      Pain Loc --      Pain Edu? --      Excl. in GC? --     Constitutional: Alert, no distress. Does not appear to be intoxicated. ENT   Head: Normocephalic and atraumatic. Respiratory: Normal respiratory effort without tachypnea  Musculoskeletal: ormal range of motion in extremities. Ambulates without  difficulty Neurologic:  Normal speech and language. Psychiatric: Mood and affect are normal. Denies SI or HI.  ____________________________________________   INITIAL IMPRESSION / ASSESSMENT AND PLAN / ED COURSE  Pertinent labs & imaging results that were available during my care of the patient were reviewed by me and considered in my medical decision making (see chart for details).  Patient presents to the emergency department for heroin abuse. I briefly spoke with the patient who states he had been clean for 2 years however 6 months ago he began using heroin again. Patient states he wants to detox. He went to [facility] and was referred to a methadone clinic which has worked successfully in the past for the patient. Patient cannot be seen at the methadone clinic until tomorrow so the patient came to [facility] to be seen. Shortly after initial conversation the patient asks if we do Suboxone or methadone in the emergency department. I informed the patient that we do not do Suboxone or methadone in the emergency department but that we would be happy to help him get to a detox facility such as residential treatment services of [facility]. Patient states he is not interested in this option as methadone has worked well for him in the past. He states he is going to wait until tomorrow to be seen at the methadone clinic. Patient is asking to leave the emergency department now.  Visual physical examination was performed only. The patient appeared well, in no distress, no obvious signs of trauma or injury. Patient did not answer review of systems questioning. We will discharge the patient home.   ____________________________________________   FINAL CLINICAL IMPRESSION(S) / ED DIAGNOSES  Heroin abuse    Minna Antis, MD 09/25/16 1844

## 2016-09-25 NOTE — ED Provider Notes (Signed)
[facility]-EMERGENCY DEPT Provider Note   CSN: [id number] Arrival date & time: 09/24/16  2310  By signing my name below, I, Edward Durham, attest that this documentation has been prepared under the direction and in the presence of Edward Crumble, MD . Electronically Signed: Freida Durham, Scribe. 09/25/2016. 2:34 AM.  History   Chief Complaint Chief Complaint  Patient presents with  . Drug Problem    The history is provided by the patient. No language interpreter was used.    HPI Comments:  Edward Durham is a 51 y.o. male who presents to the Emergency Department requesting detox from heroin. He states he has been using "a lot" of heroin and wants to get help. Pt admits to intravenous administration; last dose was today. He denies h/o prior rehabilitation. He denies ETOH use/use of other illicit drugs, and SI/HI. His only physical complaints at this time are nausea, vomiting and leg cramping. No alleviating factors noted.   Past Medical History:  Diagnosis Date  . Depression   . Heroin addiction (HCC)   . Pneumonia   . Stroke [facility])     Patient Active Problem List   Diagnosis Date Noted  . Pneumonia 02/27/2015    Past Surgical History:  Procedure Laterality Date  . CERVICAL SPINE SURGERY         Home Medications    Prior to Admission medications   Medication Sig Start Date End Date Taking? Authorizing Provider  carisoprodol (SOMA) 350 MG tablet Take 1 tablet (350 mg total) by mouth 3 (three) times daily as needed for muscle spasms. 07/01/15   Myrna Blazer, MD  cefUROXime (CEFTIN) 250 MG tablet Take 1 tablet (250 mg total) by mouth 2 (two) times daily with a meal. 03/01/15   Altamese Dilling, MD  citalopram (CELEXA) 20 MG tablet Take 20 mg by mouth daily.    Historical Provider, MD  clonazePAM (KLONOPIN) 1 MG tablet Take 1 mg by mouth at bedtime as needed for anxiety.     Historical Provider, MD  HYDROcodone-acetaminophen (NORCO) 7.5-325 MG per tablet  Take 1 tablet by mouth every 8 (eight) hours. Every 8 to 12 hours    Historical Provider, MD  [facility] ER 20 MG T24A Take 1 tablet by mouth daily.    Historical Provider, MD  ibuprofen (ADVIL,MOTRIN) 400 MG tablet Take 400 mg by mouth every 12 (twelve) hours as needed for mild pain.    Historical Provider, MD  meloxicam (MOBIC) 15 MG tablet Take 1 tablet (15 mg total) by mouth daily. 07/06/16   Delorise Royals Cuthriell, PA-C  predniSONE (STERAPRED UNI-PAK 21 TAB) 10 MG (21) TBPK tablet Take 6 tabs first day, 5 tab on day 2, then 4 on day 3rd, 3 tabs on day 4th , 2 tab on day 5th, and 1 tab on 6th day. 03/01/15   Altamese Dilling, MD  PROAIR HFA 108 (90 BASE) MCG/ACT inhaler Inhale 2 puffs into the lungs every 4 (four) hours as needed for wheezing.     Historical Provider, MD  QUEtiapine (SEROQUEL) 100 MG tablet Take 100 mg by mouth at bedtime.    Historical Provider, MD  SPIRIVA HANDIHALER 18 MCG inhalation capsule Place 1 capsule into inhaler and inhale daily.    Historical Provider, MD  traZODone (DESYREL) 50 MG tablet Take 50 mg by mouth at bedtime as needed for sleep.     Historical Provider, MD    Family History No family history on file.  Social History Social History  Substance Use Topics  . Smoking status: Current Every Day Smoker    Packs/day: 1.00    Types: Cigarettes  . Smokeless tobacco: Never Used  . Alcohol use Yes     Allergies   Patient has no known allergies.   Review of Systems Review of Systems  All systems reviewed and are negative for acute change except as noted in the HPI.   Physical Exam Updated Vital Signs BP 102/68 (BP Location: Right Arm)   Temp 98 F (36.7 C) (Oral)   Resp 18   Ht  (1.702 m)   Wt 177 lb (80.3 kg)   SpO2 94%   BMI 27.72 kg/m   Physical Exam  Constitutional: He is oriented to person, place, and time. Vital signs are normal. He appears well-developed and well-nourished.  Non-toxic appearance. He does not appear ill. No  distress.  HENT:  Head: Normocephalic and atraumatic.  Nose: Nose normal.  Mouth/Throat: Oropharynx is clear and moist. No oropharyngeal exudate.  Eyes: Conjunctivae and EOM are normal. Pupils are equal, round, and reactive to light. No scleral icterus.  Neck: Normal range of motion. Neck supple. No tracheal deviation, no edema, no erythema and normal range of motion present. No thyroid mass and no thyromegaly present.  Cardiovascular: Normal rate, regular rhythm, S1 normal, S2 normal, normal heart sounds, intact distal pulses and normal pulses.  Exam reveals no gallop and no friction rub.   No murmur heard. Pulmonary/Chest: Effort normal and breath sounds normal. No respiratory distress. He has no wheezes. He has no rhonchi. He has no rales.  Abdominal: Soft. Normal appearance and bowel sounds are normal. He exhibits no distension, no ascites and no mass. There is no hepatosplenomegaly. There is no tenderness. There is no rebound, no guarding and no CVA tenderness.  Musculoskeletal: Normal range of motion. He exhibits no edema or tenderness.  Lymphadenopathy:    He has no cervical adenopathy.  Neurological: He is alert and oriented to person, place, and time. He has normal strength. No cranial nerve deficit or sensory deficit.  Skin: Skin is warm, dry and intact. No petechiae and no rash noted. He is not diaphoretic. No erythema. No pallor.  Psychiatric:  No SI/HI  Nursing note and vitals reviewed.    ED Treatments / Results  DIAGNOSTIC STUDIES:  Oxygen Saturation is 94% on RA, adequate by my interpretation.    COORDINATION OF CARE:  2:34 AM Discussed treatment plan with pt at bedside and pt agreed to plan.  Labs (all labs ordered are listed, but only abnormal results are displayed) Labs Reviewed  ETHANOL - Abnormal; Notable for the following:       Result Value   Alcohol, Ethyl (B) 38 (*)    All other components within normal limits  RAPID URINE DRUG SCREEN, HOSP PERFORMED -  Abnormal; Notable for the following:    Opiates POSITIVE (*)    Benzodiazepines POSITIVE (*)    Tetrahydrocannabinol POSITIVE (*)    All other components within normal limits  CBC WITH DIFFERENTIAL/PLATELET - Abnormal; Notable for the following:    RBC 3.96 (*)    Hemoglobin 10.7 (*)    HCT 34.6 (*)    All other components within normal limits  COMPREHENSIVE METABOLIC PANEL - Abnormal; Notable for the following:    Chloride 99 (*)    Glucose, Bld 108 (*)    Calcium 8.4 (*)    AST 53 (*)    All other components within normal limits  EKG  EKG Interpretation None       Radiology No results found.  Procedures Procedures (including critical care time)  Medications Ordered in ED Medications - No data to display   Initial Impression / Assessment and Plan / ED Course  I have reviewed the triage vital signs and the nursing notes.  Pertinent labs & imaging results that were available during my care of the patient were reviewed by me and considered in my medical decision making (see chart for details).     Patient presents to the ED for heroin abuse and requesting detox.  He was medically cleared and labs only reveal etoh of 38 and UDS positive for marijuana, opiates, and benzodiazepines.  He was advised to go straight to behavior [facility] for further care. He and his wife demonstrate good understanding of the plan.  He appears well and in NAD. VS remain within his normal limits and he is safe for Dc.  Final Clinical Impressions(s) / ED Diagnoses   Final diagnoses:  None    New Prescriptions New Prescriptions   No medications on file   I personally performed the services described in this documentation, which was scribed in my presence. The recorded information has been reviewed and is accurate.       Edward Crumble, MD 09/25/16 [phone]

## 2016-09-25 NOTE — ED Triage Notes (Addendum)
Pt requesting detox from heroin, last used this AM, alert and oriented in no acute distress, denies any SI, was seen at [place] this AM for same

## 2016-09-26 IMAGING — CR DG CHEST 1V PORT
1 series · 1 of 1 positions shown · non-contrast
Comparison: May 01, 2014.

CLINICAL DATA: Pneumonia.

EXAM:
PORTABLE CHEST - 1 VIEW

[ap]
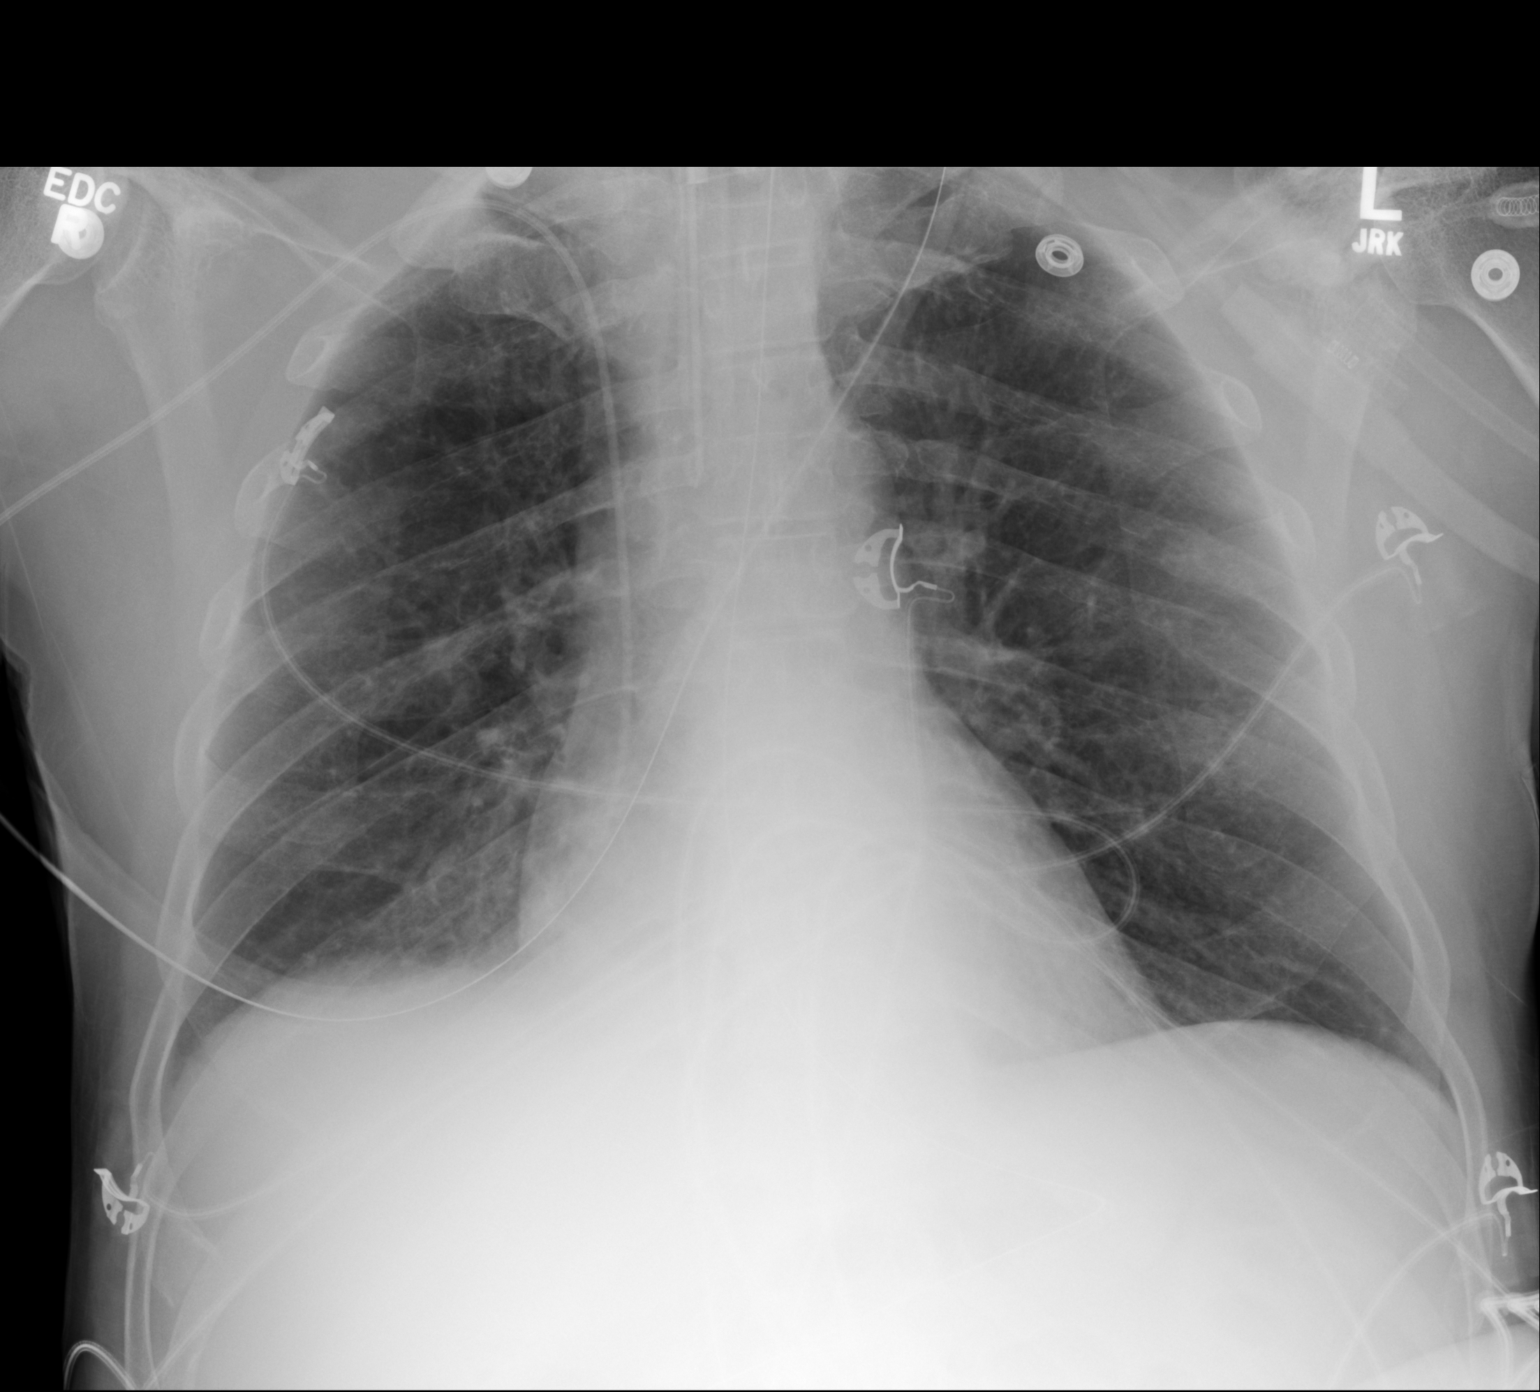

[1 of 1 positions shown; findings below may reference images not displayed]

FINDINGS: The heart size and mediastinal contours are within normal limits. No
pneumothorax or pleural effusion is noted. Endotracheal and
nasogastric tubes are in grossly good position. Right sided PICC
line is again noted with distal tip overlying expected position of
the SVC. Left lung is clear. Right basilar opacity is significantly
decreased compared to prior exam. The visualized skeletal structures
are unremarkable.
IMPRESSION: Stable support apparatus. Decreased right basilar opacity is noted
consistent with improving pneumonia or atelectasis.

## 2016-09-30 DIAGNOSIS — F1020 Alcohol dependence, uncomplicated: Secondary | ICD-10-CM | POA: Diagnosis not present

## 2016-09-30 DIAGNOSIS — F1420 Cocaine dependence, uncomplicated: Secondary | ICD-10-CM | POA: Diagnosis not present

## 2016-09-30 DIAGNOSIS — F1220 Cannabis dependence, uncomplicated: Secondary | ICD-10-CM | POA: Diagnosis not present

## 2016-09-30 DIAGNOSIS — Z79899 Other long term (current) drug therapy: Secondary | ICD-10-CM | POA: Diagnosis not present

## 2016-11-03 ENCOUNTER — Emergency Department: Payer: Medicare HMO

## 2016-11-03 ENCOUNTER — Encounter: Payer: Self-pay | Admitting: *Deleted

## 2016-11-03 ENCOUNTER — Emergency Department
Admission: EM | Admit: 2016-11-03 | Discharge: 2016-11-03 | Disposition: A | Discharge status: Home / Self Care | Payer: Medicare HMO | Attending: Emergency Medicine | Admitting: Emergency Medicine

## 2016-11-03 DIAGNOSIS — F17210 Nicotine dependence, cigarettes, uncomplicated: Secondary | ICD-10-CM | POA: Insufficient documentation

## 2016-11-03 DIAGNOSIS — J209 Acute bronchitis, unspecified: Secondary | ICD-10-CM | POA: Diagnosis not present

## 2016-11-03 DIAGNOSIS — R062 Wheezing: Secondary | ICD-10-CM

## 2016-11-03 DIAGNOSIS — R509 Fever, unspecified: Secondary | ICD-10-CM | POA: Diagnosis present

## 2016-11-03 DIAGNOSIS — Z79899 Other long term (current) drug therapy: Secondary | ICD-10-CM | POA: Diagnosis not present

## 2016-11-03 DIAGNOSIS — Z8673 Personal history of transient ischemic attack (TIA), and cerebral infarction without residual deficits: Secondary | ICD-10-CM | POA: Insufficient documentation

## 2016-11-03 DIAGNOSIS — J40 Bronchitis, not specified as acute or chronic: Secondary | ICD-10-CM

## 2016-11-03 DIAGNOSIS — Z720 Tobacco use: Secondary | ICD-10-CM

## 2016-11-03 DIAGNOSIS — R918 Other nonspecific abnormal finding of lung field: Secondary | ICD-10-CM | POA: Diagnosis not present

## 2016-11-03 LAB — POCT RAPID STREP A: Streptococcus, Group A Screen (Direct): NEGATIVE

## 2016-11-03 MED ORDER — AMOXICILLIN-POT CLAVULANATE 875-125 MG PO TABS: 1.0000 | ORAL_TABLET | Freq: Two times a day (BID) | ORAL | 0 refills | Status: AC

## 2016-11-03 MED ORDER — IPRATROPIUM BROMIDE 0.02 % IN SOLN
0.5000 mg | Freq: Once | RESPIRATORY_TRACT | Status: AC
  Administered 2016-11-03: 0.5 mg via RESPIRATORY_TRACT
  Filled 2016-11-03: qty 2.5

## 2016-11-03 MED ORDER — PREDNISONE 20 MG PO TABS: 60.0000 mg | ORAL_TABLET | Freq: Every day | ORAL | 0 refills | Status: AC

## 2016-11-03 MED ORDER — ALBUTEROL SULFATE (2.5 MG/3ML) 0.083% IN NEBU
5.0000 mg | INHALATION_SOLUTION | Freq: Once | RESPIRATORY_TRACT | Status: AC
  Administered 2016-11-03: 5 mg via RESPIRATORY_TRACT

## 2016-11-03 MED ORDER — ALBUTEROL SULFATE HFA 108 (90 BASE) MCG/ACT IN AERS: 2.0000 | INHALATION_SPRAY | Freq: Four times a day (QID) | RESPIRATORY_TRACT | 2 refills | Status: AC | PRN

## 2016-11-03 MED ORDER — PREDNISONE 20 MG PO TABS
60.0000 mg | ORAL_TABLET | Freq: Once | ORAL | Status: AC
  Administered 2016-11-03: 60 mg via ORAL
  Filled 2016-11-03: qty 3

## 2016-11-03 MED ORDER — AMOXICILLIN-POT CLAVULANATE 875-125 MG PO TABS
1.0000 | ORAL_TABLET | Freq: Once | ORAL | Status: AC
  Administered 2016-11-03: 1 via ORAL
  Filled 2016-11-03: qty 1

## 2016-11-03 NOTE — ED Triage Notes (Signed)
Pt reports "feeling bad" since Friday with a dry cough and fever of "99.6" Pt reports having a sore throat and rib pain with coughing. No NVD or other symptoms reported at this time. Pt reports having SOB upon exertion.

## 2016-11-03 NOTE — Discharge Instructions (Signed)
Return to the emergency room for any new or worrisome symptoms including increased cough or wheeze shortness of breath, chest pain weakness high fever that is persistent or you feel worse in any way. Follow closely with your doctor or the one that we have referred you to the day after tomorrow, after the holiday, without fail.

## 2016-11-03 NOTE — ED Provider Notes (Signed)
[facility] Emergency Department Provider Note  ____________________________________________   I have reviewed the triage vital signs and the nursing notes.   HISTORY  Chief Complaint Fever    HPI Edward Durham, Edward Durham is a 51 y.o. male who presents today complaining of cough, and runny nose, and low-grade fever. He has had symptoms for 2 days. He does have a history of pneumonia is concerned that he will get another one. He denies any chest pain or leg swelling.He has had symptoms since 2 days ago. Nothing makes it better and nothing makes it worse, has not tried anything at home.    Past Medical History:  Diagnosis Date  . Depression   . Heroin addiction (HCC)   . Pneumonia   . Stroke [facility](HCC)     Patient Active Problem List   Diagnosis Date Noted  . Pneumonia 02/27/2015    Past Surgical History:  Procedure Laterality Date  . CERVICAL SPINE SURGERY      Prior to Admission medications   Medication Sig Start Date End Date Taking? Authorizing Provider  carisoprodol (SOMA) 350 MG tablet Take 1 tablet (350 mg total) by mouth 3 (three) times daily as needed for muscle spasms. 07/01/15   Myrna BlazerSchaevitz, David Matthew, MD  cefUROXime (CEFTIN) 250 MG tablet Take 1 tablet (250 mg total) by mouth 2 (two) times daily with a meal. 03/01/15   Altamese DillingVachhani, Vaibhavkumar, MD  citalopram (CELEXA) 20 MG tablet Take 20 mg by mouth daily.    [provider]  clonazePAM (KLONOPIN) 1 MG tablet Take 1 mg by mouth at bedtime as needed for anxiety.     [provider]  HYDROcodone-acetaminophen (NORCO) 7.5-325 MG per tablet Take 1 tablet by mouth every 8 (eight) hours. Every 8 to 12 hours    [provider]  [facility] MG T24A Take 1 tablet by mouth daily.    [provider]  ibuprofen (ADVIL,MOTRIN) 400 MG tablet Take 400 mg by mouth every 12 (twelve) hours as needed for mild pain.    [provider]  meloxicam (MOBIC) 15 MG tablet  Take 1 tablet (15 mg total) by mouth daily. 07/06/16   Cuthriell, Delorise RoyalsJonathan D, PA-C  predniSONE (STERAPRED UNI-PAK 21 TAB) 10 MG (21) TBPK tablet Take 6 tabs first day, 5 tab on day 2, then 4 on day 3rd, 3 tabs on day 4th , 2 tab on day 5th, and 1 tab on 6th day. 03/01/15   Altamese DillingVachhani, Vaibhavkumar, MD  PROAIR HFA 108 (90 BASE) MCG/ACT inhaler Inhale 2 puffs into the lungs every 4 (four) hours as needed for wheezing.     [provider]  QUEtiapine (SEROQUEL) 100 MG tablet Take 100 mg by mouth at bedtime.    [provider]  SPIRIVA HANDIHALER 18 MCG inhalation capsule Place 1 capsule into inhaler and inhale daily.    [provider]  traZODone (DESYREL) 50 MG tablet Take 50 mg by mouth at bedtime as needed for sleep.     [provider]    Allergies Morphine and related  History reviewed. No pertinent family history.  Social History Social History  Substance Use Topics  . Smoking status: Current Every Day Smoker    Packs/day: 1.00    Types: Cigarettes  . Smokeless tobacco: Never Used  . Alcohol use Yes    Review of Systems Constitutional: + fever/ -chills Eyes: No visual changes. ENT: No sore throat. No stiff neck no neck pain Cardiovascular: Denies chest  pain. Respiratory: Positive shortness of breath. Cough and wheeze Gastrointestinal:   no vomiting.  No diarrhea.  No constipation. Genitourinary: Negative for dysuria. Musculoskeletal: Negative lower extremity swelling Skin: Negative for rash. Neurological: Negative for severe headaches, focal weakness or numbness.   ____________________________________________   PHYSICAL EXAM:  VITAL SIGNS: ED Triage Vitals  Enc Vitals Group     BP 11/03/16 1949 123/61     Pulse Rate 11/03/16 1949 86     Resp 11/03/16 1949 20     Temp 11/03/16 1949 98.5 F (36.9 C)     Temp Source 11/03/16 1949 Oral     SpO2 11/03/16 1949 94 %     Weight 11/03/16 1945 180 lb (81.6 kg)     Height 11/03/16 1945 5\' 8"   (1.727 m)     Head Circumference --      Peak Flow --      Pain Score 11/03/16 1945 7     Pain Loc --      Pain Edu? --      Excl. in GC? --     Constitutional: Alert and oriented. Well appearing and in no acute distress., Patient is coughing and wheezing occasionally Eyes: Conjunctivae are normal Head: Atraumatic HEENT: No congestion/rhinnorhea. Mucous membranes are moist.  Oropharynx non-erythematous Neck:   Nontender with no meningismus, no masses, no stridor Cardiovascular: Normal rate, regular rhythm. Grossly normal heart sounds.  Good peripheral circulation. Respiratory: Patient has normal respiratory effort there are no retractions his lungs however have diffuse wheeze and occasional rhonchi. Abdominal: Soft and nontender. No distention. No guarding no rebound Back:  There is no focal tenderness or step off.  there is no midline tenderness there are no lesions noted. there is no CVA tenderness Musculoskeletal: No lower extremity tenderness, no upper extremity tenderness. No joint effusions, no DVT signs strong distal pulses no edema Neurologic:  Normal speech and language. No gross focal neurologic deficits are appreciated.  Skin:  Skin is warm, dry and intact. No rash noted. Psychiatric: Mood and affect are normal. Speech and behavior are normal.  ____________________________________________   LABS (all labs ordered are listed, but only abnormal results are displayed)  Labs Reviewed  POCT RAPID STREP A   ____________________________________________  EKG  I personally interpreted any EKGs ordered by me or triage  ____________________________________________  RADIOLOGY  I reviewed any imaging ordered by me or triage that were performed during my shift and, if possible, patient and/or family made aware of any abnormal findings. ____________________________________________   PROCEDURES  Procedure(s) performed: None  Procedures  Critical Care performed:  None  ____________________________________________   INITIAL IMPRESSION / ASSESSMENT AND PLAN / ED COURSE  Pertinent labs & imaging results that were available during my care of the patient were reviewed by me and considered in my medical decision making (see chart for details).  Patient here with cough and wheeze with a history of pneumonia in the past. According to him it was significant. We have given him albuterol, his lungs at this time are much improved. I have given him antibiotics and we will continue that, we will also give him steroids at home and here. Patient feels much better sats are 98% sitting there and he states he feels much improvement. I have counseled him to stop smoking, 6 minutes of counseling for tobacco cessation were administered. Return precautions and follow-up given and understood.    ____________________________________________   FINAL CLINICAL IMPRESSION(S) / ED DIAGNOSES  Final diagnoses:  None  This chart was dictated using voice recognition software.  Despite best efforts to proofread,  errors can occur which can change meaning.      Jeanmarie Plant, MD 11/03/16 2230

## 2017-01-08 DEATH — deceased
